# Patient Record
Sex: Female | Born: 1970 | Race: White | Hispanic: No | Marital: Married | Smoking: Never smoker
Health system: Southern US, Community
[De-identification: ages and names within clinical notes are randomized; demographics above are authoritative.]

## PROBLEM LIST (undated history)

## (undated) DIAGNOSIS — R51 Headache: Secondary | ICD-10-CM

## (undated) DIAGNOSIS — R519 Headache, unspecified: Secondary | ICD-10-CM

## (undated) DIAGNOSIS — F418 Other specified anxiety disorders: Secondary | ICD-10-CM

## (undated) HISTORY — DX: Other specified anxiety disorders: F41.8

## (undated) HISTORY — PX: TUBAL LIGATION: SHX77

## (undated) HISTORY — DX: Headache, unspecified: R51.9

## (undated) HISTORY — DX: Headache: R51

---
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
## 2004-08-19 ENCOUNTER — Emergency Department (HOSPITAL_COMMUNITY): Admission: EM | Admit: 2004-08-19 | Discharge: 2004-08-19 | Payer: Self-pay | Admitting: Emergency Medicine

## 2005-04-29 ENCOUNTER — Other Ambulatory Visit: Admission: RE | Admit: 2005-04-29 | Discharge: 2005-04-29 | Payer: Self-pay | Admitting: Family Medicine

## 2006-05-20 ENCOUNTER — Other Ambulatory Visit: Admission: RE | Admit: 2006-05-20 | Discharge: 2006-05-20 | Payer: Self-pay | Admitting: Family Medicine

## 2006-08-01 ENCOUNTER — Inpatient Hospital Stay (HOSPITAL_COMMUNITY): Admission: AD | Admit: 2006-08-01 | Discharge: 2006-08-01 | Payer: Self-pay | Admitting: Obstetrics and Gynecology

## 2006-09-17 ENCOUNTER — Inpatient Hospital Stay (HOSPITAL_COMMUNITY): Admission: AD | Admit: 2006-09-17 | Discharge: 2006-09-17 | Payer: Self-pay | Admitting: Obstetrics and Gynecology

## 2006-10-30 ENCOUNTER — Inpatient Hospital Stay (HOSPITAL_COMMUNITY): Admission: AD | Admit: 2006-10-30 | Discharge: 2006-10-30 | Payer: Self-pay | Admitting: Obstetrics and Gynecology

## 2006-11-29 ENCOUNTER — Inpatient Hospital Stay (HOSPITAL_COMMUNITY): Admission: AD | Admit: 2006-11-29 | Discharge: 2006-11-29 | Payer: Self-pay | Admitting: Obstetrics and Gynecology

## 2006-12-22 ENCOUNTER — Encounter: Admission: RE | Admit: 2006-12-22 | Discharge: 2006-12-22 | Payer: Self-pay | Admitting: Obstetrics and Gynecology

## 2007-03-10 ENCOUNTER — Inpatient Hospital Stay (HOSPITAL_COMMUNITY): Admission: RE | Admit: 2007-03-10 | Discharge: 2007-03-12 | Payer: Self-pay | Admitting: Obstetrics and Gynecology

## 2007-03-11 ENCOUNTER — Encounter (INDEPENDENT_AMBULATORY_CARE_PROVIDER_SITE_OTHER): Payer: Self-pay | Admitting: Obstetrics and Gynecology

## 2007-10-26 HISTORY — PX: LUMBAR DISC SURGERY: SHX700

## 2007-11-09 ENCOUNTER — Ambulatory Visit (HOSPITAL_COMMUNITY): Admission: RE | Admit: 2007-11-09 | Discharge: 2007-11-10 | Payer: Self-pay | Admitting: Orthopedic Surgery

## 2008-06-19 IMAGING — CR DG LUMBAR SPINE 2-3V
3 series · 3 of 3 positions shown · non-contrast
Comparison: none

CLINICAL DATA: L4-5 herniated disk.  
LUMBAR SPINE ? 3 VIEW:

[view not recorded (1 of 3)]
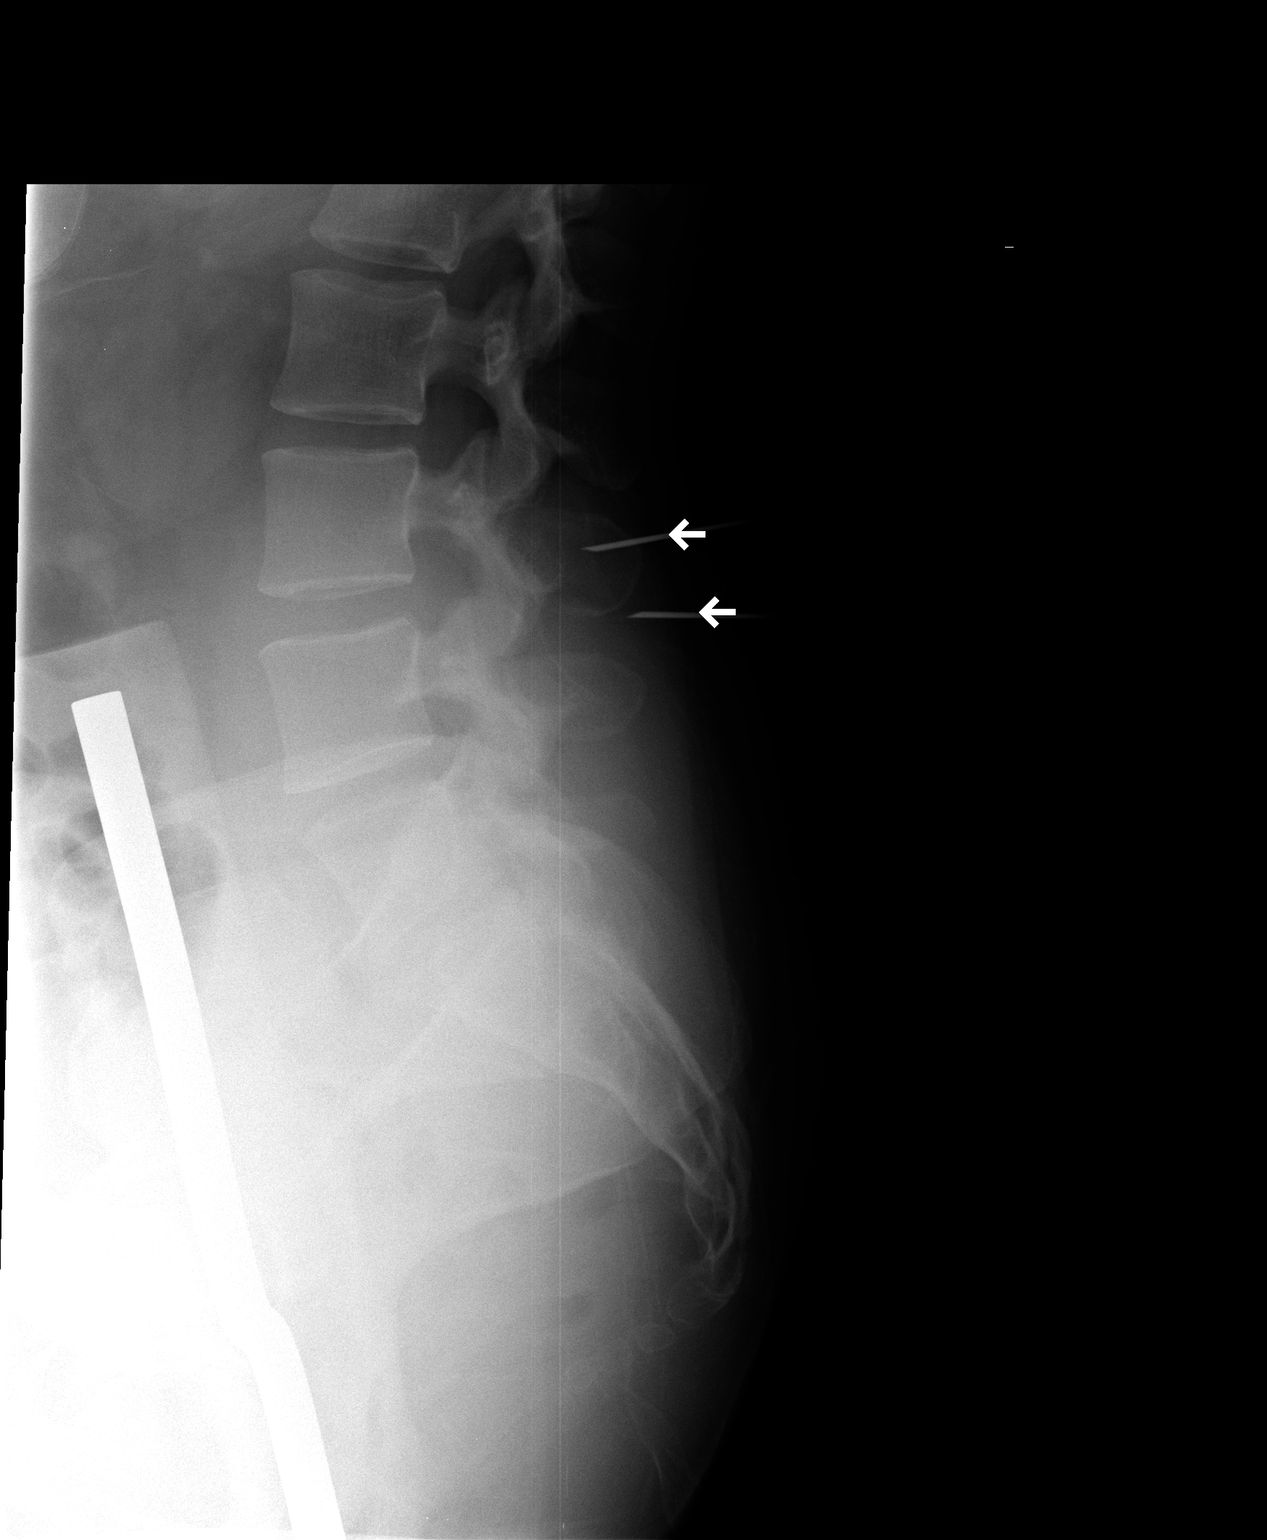

[view not recorded (2 of 3)]
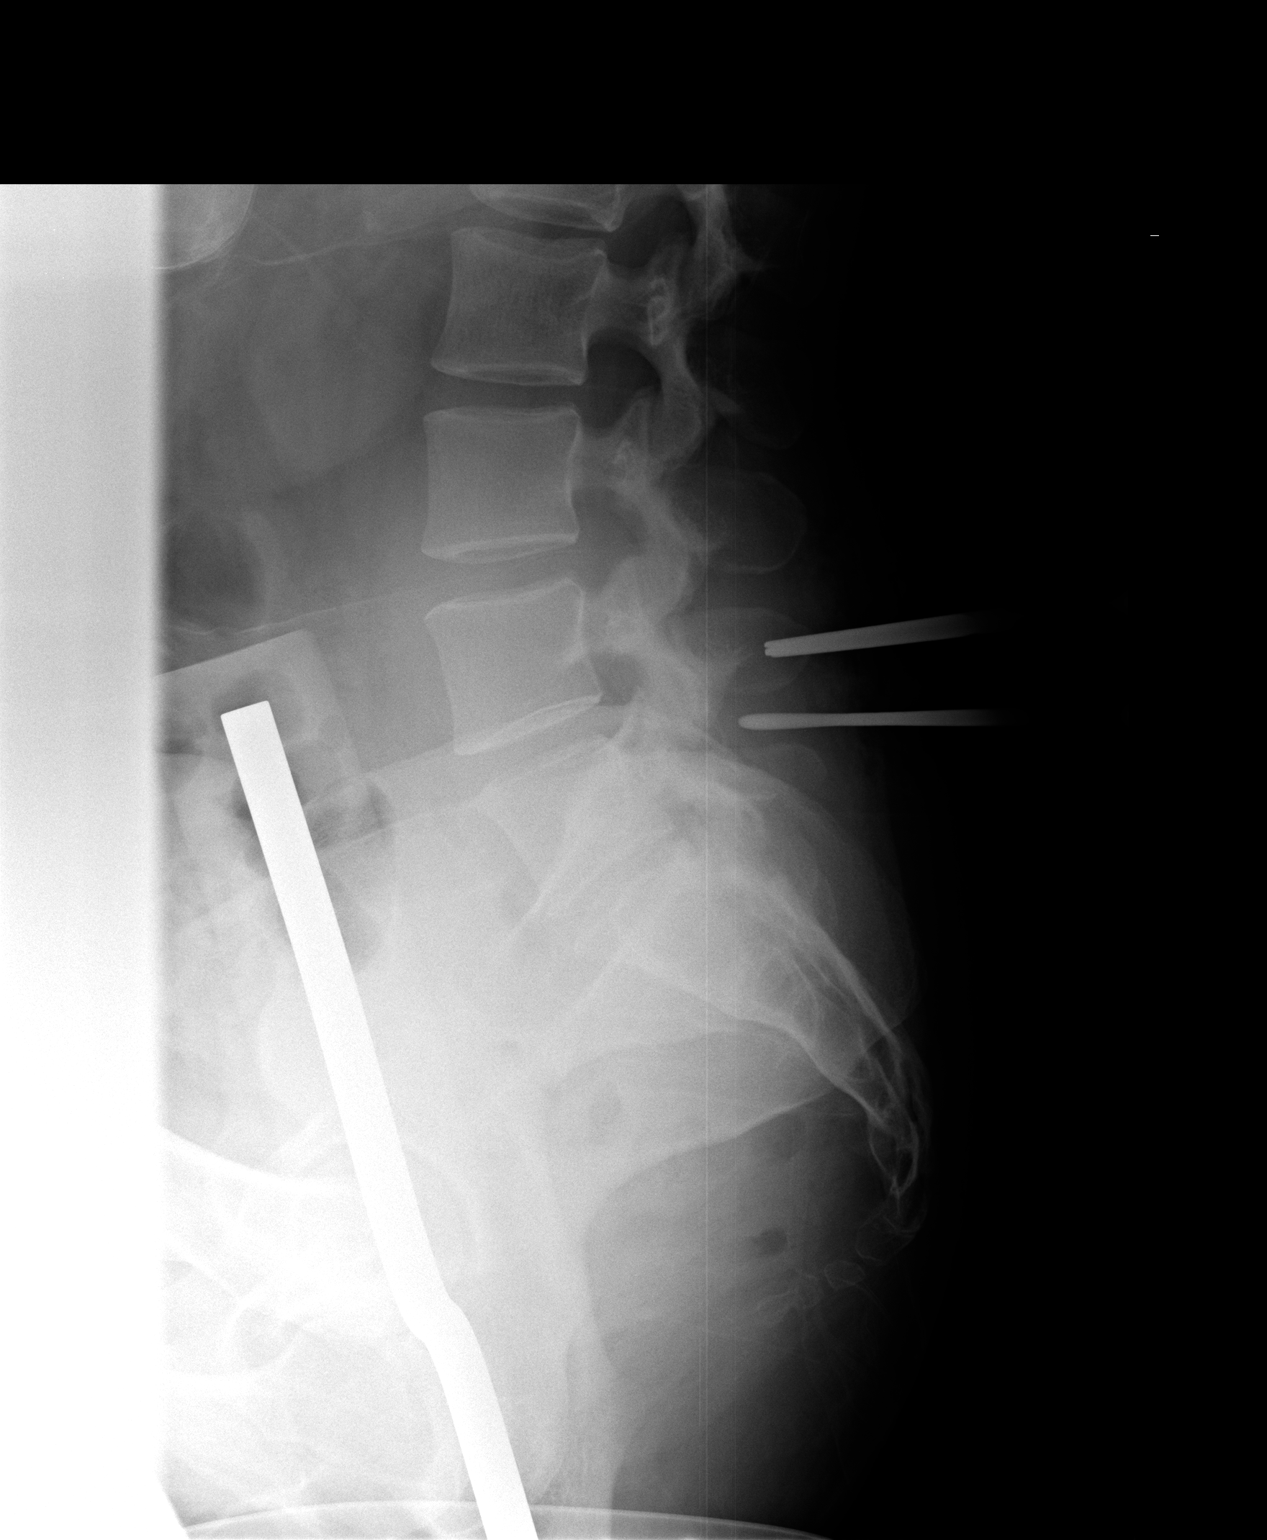

[view not recorded (3 of 3)]
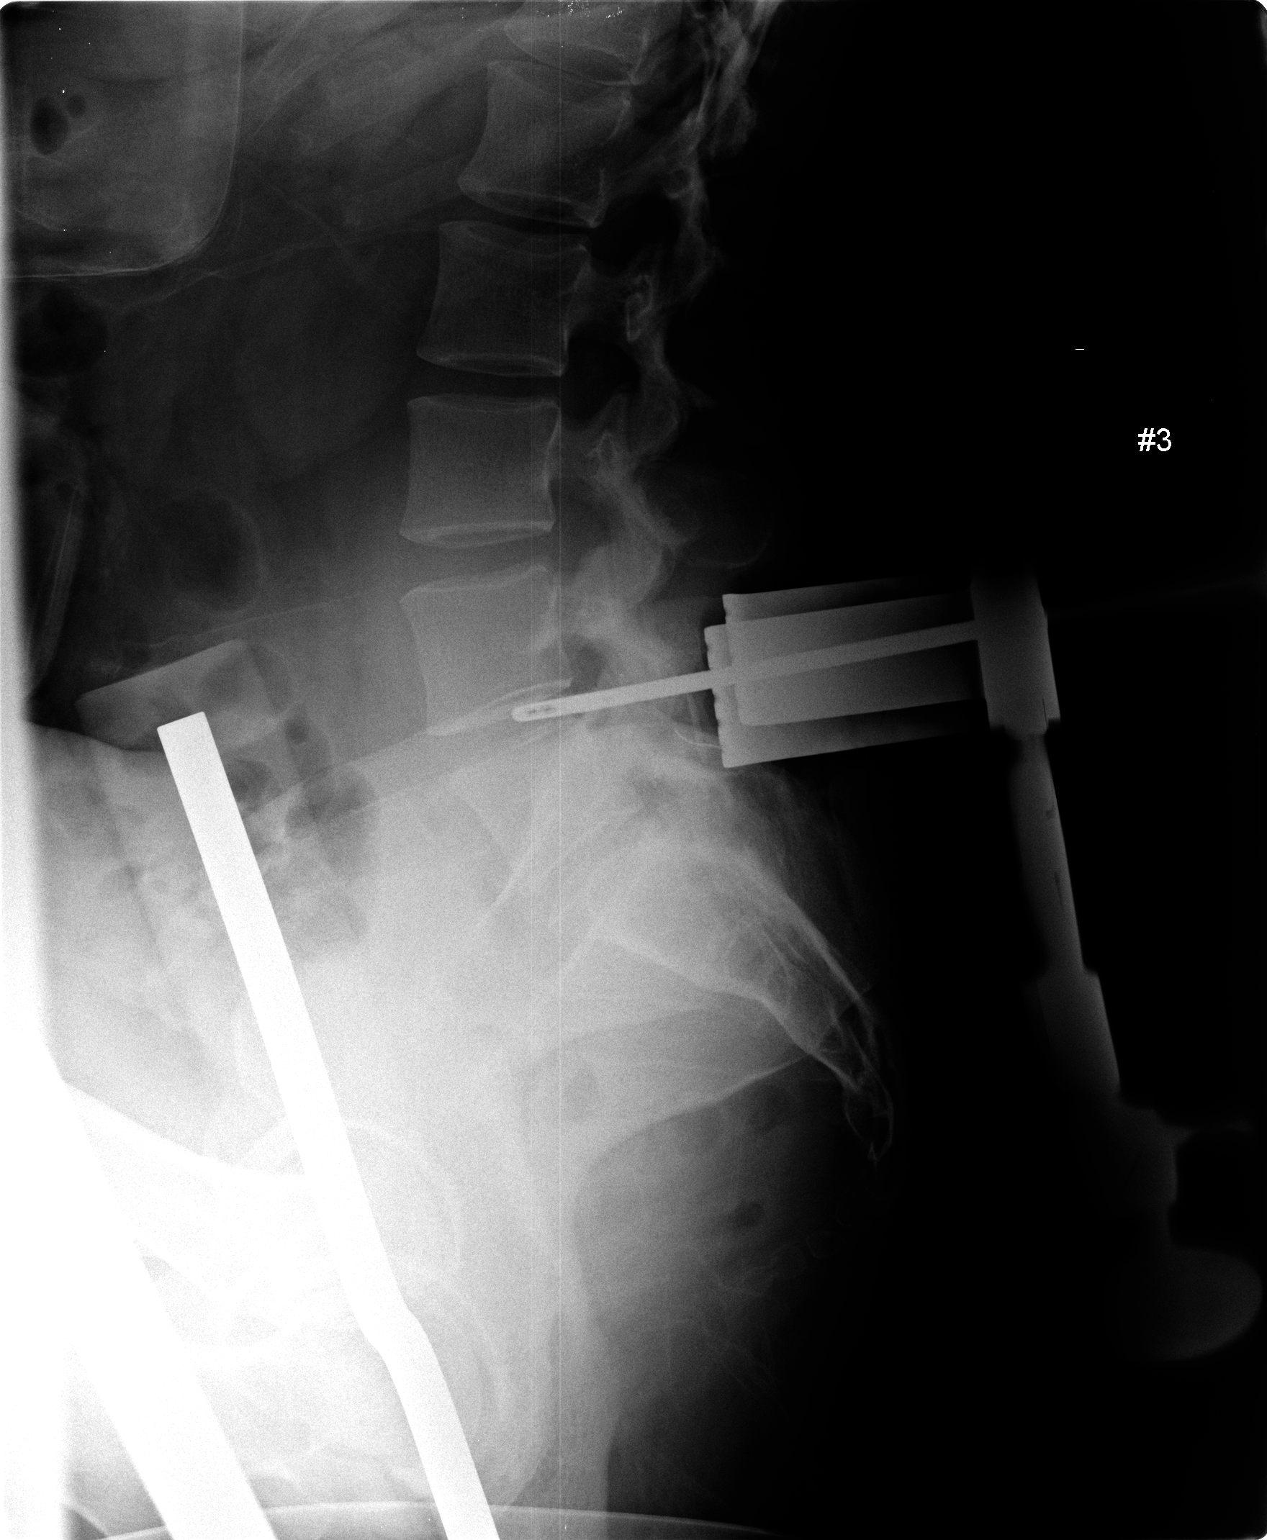

[3 of 3 positions shown; findings below may reference images not displayed]

FINDINGS: Two intraoperative lumbar spine films were obtained for localization.  Film 1 demonstrates radiopaque needles posterior to the L3-4.  Film 2 demonstrates localizers posterior to L4-5.
IMPRESSION: Operative localization L4-5.

## 2011-03-09 NOTE — Op Note (Signed)
NAME:  Meghan Caldwell, Meghan Caldwell NO.:  [id number]   MEDICAL RECORD NO.:  [id number]          PATIENT TYPE:  INP   LOCATION:  9128                          FACILITY:  [facility]   PHYSICIAN:  Huel Cote, M.D. DATE OF BIRTH:  [birth year]   DATE OF PROCEDURE:  03/11/2007  DATE OF DISCHARGE:                               OPERATIVE REPORT   PREOPERATIVE DIAGNOSES:  1. Status post normal spontaneous vaginal delivery.  2. Desires permanent sterility.  3. Previous ectopic pregnancy on the left, with surgery on that side.      The patient is unsure what exactly was done.   POSTOPERATIVE DIAGNOSES:  1. Status post normal spontaneous vaginal delivery.  2. Desires permanent sterility.  3. Previous ectopic pregnancy on the left, with surgery on that side.      The patient is unsure what exactly was done.   PROCEDURE:  Partial right salpingectomy.  The left tube appeared to have  a stump only, with a probable previous salpingectomy of that tube with  her ectopic pregnancy.   SURGEON:  Huel Cote, M.D.   ASSISTANT:  None.   ANESTHESIA:  Spinal.   ESTIMATED BLOOD LOSS:  Minimal.   IV FLUIDS:  1800 mL lactated Ringers.   FINDINGS:  The right tube and ovary were within normal limits.  The left  tubal stump was noted to be approximately 3 cm long; it was blind  ending.  There were adhesions noted to the left adnexa, with a possible  focus of endometriosis on the left ovary; however, adhesions of the  mesentery and bowel in that area did not make it possible to further  investigate this.  There was no further left tube identified beyond the  stump.   PROCEDURE:  The patient was taken to the operating room, where spinal  anesthesia was obtained without difficulty.  She was then prepped and  draped in a normal sterile fashion in the dorsal supine position.  A  small umbilical incision was then made at the preexisting scar, and  carried through to underlying layer of fascia by  sharp dissection and  Bovie cautery.  The fascia was then opened and the peritoneal cavity  entered bluntly.  The peritoneal cavity was inspected and appeared  normal.  There were no adhesions to the upper abdomen, although there  were adhesions noted to track down to the left adnexa.  The right adnexa  was identified.  The fallopian tube on that side was identified, grasped  with a Babcock clamp and traced out to its fimbriated end.  The tube  itself was then opened in the mesosalpinx, and a 2-3 cm buckle tied off  with 2 free ties of 0 plain.  The tubal segment was then completely  excised and the pedicle was inspected and found to be hemostatic.  It  was additionally cauterized with Bovie cautery at the tubal stumps.  This was examined and found to be hemostatic.  It was returned to the  abdomen.   Attention was then turned to the patient's left side, where it was  closely inspected.  The tubal stump was identified and approximately 3  cm long and blind ending.  There was no additional tube noted.  There  were adhesions to the left adnexa, and possibility of a focus of  endometriosis on the left ovary.  It was not easy to inspect this area  due to the adhesions.  Therefore, all was left in place and all  instruments and sponges were removed from the patient's abdomen.  The  fascia was closed in with 0 Vicryl in a running fashion, and the skin  was closed with 3-0 Vicryl subcutaneous stitch.      Huel Cote, M.D.  Electronically Signed     KR/MEDQ  D:  03/11/2007  T:  03/11/2007  Job:  [id number]

## 2011-03-09 NOTE — Op Note (Signed)
NAME:  Meghan Caldwell, Meghan Caldwell               ACCOUNT NO.:  [id number]   MEDICAL RECORD NO.:  [id number]          PATIENT TYPE:  AMB   LOCATION:  DAY                          FACILITY:  [facility]   PHYSICIAN:  Georges Lynch. Gioffre, M.D.DATE OF BIRTH:  12-03-1970   DATE OF PROCEDURE:  11/09/2007  DATE OF DISCHARGE:                               OPERATIVE REPORT   SURGEON:  Georges Lynch. Darrelyn Hillock, M.D.   ASSISTANT:  Marlowe Kays MD   PREOPERATIVE DIAGNOSIS:  Herniated lumbar disk L4-5 on the right with a  partial foot drop on the right.  Note the disk migrated somewhat  distally over the body of L5.   POSTOPERATIVE DIAGNOSIS:  Herniated lumbar disk L4-5 on the right with a  partial foot drop on the right.  Note the disk migrated somewhat  distally over the body of L5.   OPERATION:  1. Hemilaminectomy, microdiskectomy at L4-5 on the right.  2. Foraminotomy at L4-5 of the right.   PROCEDURE:  Under general anesthesia the patient on spinal frame,  routine orthopedic prep and drape of the lower back was carried out.  At  this time the patient had 1 gram of IV Ancef.  Two needles were placed  in the back for localization purposes and an x-ray was taken.  Once we  localized the L4-5 space, an incision was made directly over the  midline.  Bleeders identified and cauterized.  The muscle then was  separated the lamina and spinous process in the usual fashion.  Another  x-ray was taken to verify the position.  Following that we inserted the  [facility] retractors.  At that time we then went down and carried out  our hemilaminectomy in the usual fashion, removed the ligamentum flavum.  The microscope was used at all times for this.  We gently retracted the  dura once the dura was identified and then went out laterally and  decompressed the lateral recess and identified lateral recess veins.  Following that we cauterized lateral recess veins.  The nerve root was  identified, a foraminotomy was carried out.   The nerve root was quite  tense with a herniated disk.  We then made a cruciate incision into the  disk space and did a microdiskectomy in the usual fashion.  I utilized a  nerve hook and the Epstein curettes to go subligamentous distally,  medially and proximally to make sure there were no other loose  fragments.  We retained one large fragment that migrated somewhat distal  and was subligamentous.  We then removed the second fragment.  Following  that we went in the axilla of the root with a Penfield 4, root was free.  We went down into the foramen with the hockey-stick and the root now was  free.  We went medially and proximally and out laterally make sure there  were no other herniated disk material, there was not.  The root was  free.  We put the instrument in disk space, took another x-ray and  verified the exact position.  Thoroughly irrigated out the area and then  loosely applied  some thrombin-soaked  Gelfoam.  We then closed the wound layers usual fashion.  I left a small  deep distal portion of the wound open for drainage purposes.  Subcu was  closed with 0-0 Vicryl, skin was closed metal staples.  The patient had  30 mg IV Toradol prior to being released from surgery.  Sterile  Neosporin dressing was applied.           ______________________________  Georges Lynch Darrelyn Hillock, M.D.     RAG/MEDQ  D:  11/09/2007  T:  11/10/2007  Job:  [id number]

## 2011-03-09 NOTE — Discharge Summary (Signed)
NAME:  HELENA, SARDO NO.:  [id number]   MEDICAL RECORD NO.:  [id number]          PATIENT TYPE:  INP   LOCATION:  9128                          FACILITY:  [facility]   PHYSICIAN:  Huel Cote, M.D. DATE OF BIRTH:  05-Dec-1970   DATE OF ADMISSION:  03/10/2007  DATE OF DISCHARGE:  03/12/2007                               DISCHARGE SUMMARY   DISCHARGE DIAGNOSES:  1. Term pregnancy at 39-3/7 weeks delivered.  2. Status post normal spontaneous vaginal delivery.  3. Gestational diabetes well controlled on diet.  4. Desired permanent sterility with a postpartum sterilization with a      right salpingectomy.  The patient had a previous left salpingectomy      with an ectopic pregnancy.   DISCHARGE MEDICATIONS:  1. Motrin 600 mg p.o. every 6 hours.  2. Percocet 1-2 tablets p.o. every 4 hours p.r.n.   DISCHARGE FOLLOWUP:  The patient's follow-up in the office in 2 weeks  for an incision check and again in 6 weeks for her full postpartum exam.   HISTORY OF PRESENT ILLNESS:  The patient is a 40 year old G4, P 2-0-1-2  was admitted at 39-3/[redacted] weeks gestation for induction of labor given  increasing back pain and was generally feeling miserable. She had a  favorable cervix.  Prenatal care been complicated by advanced maternal  age with a normal first trimester screen and 18 weeks ultrasound.  Gestational diabetes had been well controlled by diet and the patient  had a history of depression stable off her medications.  She also  desired postpartum tubal ligation.   PRENATAL LABS:  Blood type A negative, antibody negative, rubella  immune, hepatitis B surface antigen negative, RPR nonreactive, GC  negative, chlamydia negative, HIV declined, 1-hour Glucola 168, 3-hour  76, 189, 208 and 182, group B strep negative.   PAST OBSTETRICAL HISTORY:  In 1989 she had an 8-pound vaginal delivery  and in 1992 she had ectopic pregnancy with a partial left salpingectomy  and in 1994 she  had a 7-pound vaginal delivery.   PAST GYN HISTORY:  Partial left salpingectomy.   PAST MEDICAL HISTORY:  History of depression stable now off medications.   PAST SURGICAL HISTORY:  Her ectopic in 1992 as stated with left  salpingectomy.   MEDICATIONS:  Prenatal vitamins.   ALLERGIES:  None   PHYSICAL EXAMINATION:  VITAL SIGNS:  She was afebrile with stable vital  signs.  Blood sugar on admission was 82.  Fetal heart rate was reactive.  PELVIC:  Cervix was 53 and -2 station.  She had rupture of membranes and  clear fluid was noted.   HOSPITAL COURSE:  She is placed on Pitocin and progressed rather quickly  to complete dilation.  She pushed well and began having variable  decelerations with contractions about 30 minutes prior to delivery.  These became more persistent at delivery and eventually the patient had  a decelerations to approximately 60-70 at a +3 station.  Therefore, with  slow recovery the kiwi vacuum was applied and over one contraction with  minimal effort the head was pulled to  crowning. Maternal effort pushed  out the remainder of the baby, Apgars nine and nine, weight was 8 pounds  1 ounce.  Placenta delivered spontaneously.  There is a small first-  degree laceration which was repaired with 2-0 Vicryl.  Cervix rectum  were intact.  Estimated blood loss was about approximately 350 mL. The  patient was admitted for routine care. On postpartum day #1 at her  request she underwent a tubal sterilization procedure as stated.  She  had a partial right salpingectomy as her left tube was apparently just a  3 cm stump and had been removed previously. She was then continued in  her postpartum care.  Her day of discharge hemoglobin was 10.4. Incision  was clean, dry, intact.  She was doing well, tolerating her pain with  p.o. medications and everything else was within normal limits.  She was  afebrile with stable vital signs.  She was counseled to return in 2  weeks for  incision check and that point we will obtain a copy of her  operative report from her ectopic pregnancy to ensure and confirm a left  salpingectomy took place but that is what was apparent at the time of  surgery.      Huel Cote, M.D.  Electronically Signed     KR/MEDQ  D:  03/12/2007  T:  03/12/2007  Job:  [id number]

## 2011-07-15 LAB — COMPREHENSIVE METABOLIC PANEL
ALT: 13
AST: 17
Albumin: 3.5
Alkaline Phosphatase: 88
Calcium: 9.1
Potassium: 4.1
Sodium: 142
Total Protein: 6.7

## 2011-07-15 LAB — URINALYSIS, ROUTINE W REFLEX MICROSCOPIC
Glucose, UA: NEGATIVE
Ketones, ur: NEGATIVE
Leukocytes, UA: NEGATIVE
Protein, ur: NEGATIVE
Urobilinogen, UA: 0.2

## 2011-07-15 LAB — TYPE AND SCREEN
ABO/RH(D): A NEG
Antibody Screen: NEGATIVE

## 2011-07-15 LAB — PROTIME-INR: Prothrombin Time: 13.5

## 2011-07-15 LAB — CBC
MCHC: 35.2
MCV: 96
Platelets: 243
RBC: 3.87
RDW: 13.1

## 2011-07-15 LAB — DIFFERENTIAL
Eosinophils Absolute: 0.1
Eosinophils Relative: 1
Lymphocytes Relative: 30
Lymphs Abs: 1.7
Monocytes Relative: 7

## 2013-06-04 ENCOUNTER — Ambulatory Visit (INDEPENDENT_AMBULATORY_CARE_PROVIDER_SITE_OTHER): Payer: BC Managed Care – PPO | Admitting: General Practice

## 2013-06-04 ENCOUNTER — Encounter: Payer: Self-pay | Admitting: General Practice

## 2013-06-04 VITALS — BP 109/77 | HR 66 | Temp 98.4°F | Ht 62.0 in | Wt 134.0 lb

## 2013-06-04 DIAGNOSIS — F411 Generalized anxiety disorder: Secondary | ICD-10-CM

## 2013-06-04 DIAGNOSIS — H00023 Hordeolum internum right eye, unspecified eyelid: Secondary | ICD-10-CM

## 2013-06-04 DIAGNOSIS — H00029 Hordeolum internum unspecified eye, unspecified eyelid: Secondary | ICD-10-CM

## 2013-06-04 MED ORDER — SULFACETAMIDE SODIUM 10 % OP SOLN: 1.0000 [drp] | Freq: Four times a day (QID) | OPHTHALMIC | Status: DC

## 2013-06-04 MED ORDER — ESCITALOPRAM OXALATE 10 MG PO TABS: 10.0000 mg | ORAL_TABLET | Freq: Every day | ORAL | Status: DC

## 2013-06-04 NOTE — Patient Instructions (Addendum)
Sty A sty (hordeolum) is an infection of a gland in the eyelid located at the base of the eyelash. A sty may develop a white or yellow head of pus. It can be puffy (swollen). Usually, the sty will burst and pus will come out on its own. They do not leave lumps in the eyelid once they drain. A sty is often confused with another form of cyst of the eyelid called a chalazion. Chalazions occur within the eyelid and not on the edge where the bases of the eyelashes are. They often are red, sore and then form firm lumps in the eyelid. CAUSES   Germs (bacteria).  Lasting (chronic) eyelid inflammation. SYMPTOMS   Tenderness, redness and swelling along the edge of the eyelid at the base of the eyelashes.  Sometimes, there is a white or yellow head of pus. It may or may not drain. DIAGNOSIS  An ophthalmologist will be able to distinguish between a sty and a chalazion and treat the condition appropriately.  TREATMENT   Styes are typically treated with warm packs (compresses) until drainage occurs.  In rare cases, medicines that kill germs (antibiotics) may be prescribed. These antibiotics may be in the form of drops, cream or pills.  If a hard lump has formed, it is generally necessary to do a small incision and remove the hardened contents of the cyst in a minor surgical procedure done in the office.  In suspicious cases, your caregiver may send the contents of the cyst to the lab to be certain that it is not a rare, but dangerous form of cancer of the glands of the eyelid. HOME CARE INSTRUCTIONS   Wash your hands often and dry them with a clean towel. Avoid touching your eyelid. This may spread the infection to other parts of the eye.  Apply heat to your eyelid for 10 to 20 minutes, several times a day, to ease pain and help to heal it faster.  Do not squeeze the sty. Allow it to drain on its own. Wash your eyelid carefully 3 to 4 times per day to remove any pus. SEEK IMMEDIATE MEDICAL CARE IF:    Your eye becomes painful or puffy (swollen).  Your vision changes.  Your sty does not drain by itself within 3 days.  Your sty comes back within a short period of time, even with treatment.  You have redness (inflammation) around the eye.  You have a fever. Document Released: 07/21/2005 Document Revised: 01/03/2012 Document Reviewed: 03/25/2009 [employer] Patient Information 2014 [employer].  

## 2013-06-04 NOTE — Progress Notes (Signed)
  Subjective:    Patient ID: Nadene Rubins, female    DOB: 12/24/1970, 42 y.o.   MRN: [id number]  HPI Patient presents today with complaints of a sty on right lower eye lid. She reports onset was two weeks ago. She reports feeling better and has almost completely cleared up. She reports using warm compresses, but denies OTC medications.  Reports feeling anxious, racing thoughts, and having mood swings at times. She reports it has gradually gotten worse. She reports try to control on her own, but not effective. She reports taking zoloft in the past and willing to try something else.     Review of Systems  Constitutional: Negative for fever and chills.  Eyes: Positive for redness. Negative for photophobia, pain, discharge, itching and visual disturbance.       Right lower inner lid red, bump  Respiratory: Negative for chest tightness and shortness of breath.   Cardiovascular: Negative for chest pain and palpitations.  Neurological: Negative for dizziness, weakness and headaches.       Objective:   Physical Exam  Constitutional: She is oriented to person, place, and time. She appears well-developed and well-nourished.  HENT:  Head: Normocephalic and atraumatic.  Right Ear: External ear normal.  Left Ear: External ear normal.  Eyes: EOM are normal. Pupils are equal, round, and reactive to light. Right eye exhibits hordeolum.  Cardiovascular: Normal rate, regular rhythm and normal heart sounds.   Pulmonary/Chest: Effort normal and breath sounds normal.  Neurological: She is alert and oriented to person, place, and time.  Skin: Skin is warm and dry.  Psychiatric: She has a normal mood and affect.          Assessment & Plan:  1. Sty, internal, right - sulfacetamide (BLEPH-10) 10 % ophthalmic solution; Place 1 drop into the right eye 4 (four) times daily.  Dispense: 15 mL; Refill: 1 -warm compresses to right eyelid as directed -Sty instruction sheet provided and discussed   2.  Generalized anxiety disorder - escitalopram (LEXAPRO) 10 MG tablet; Take 1 tablet (10 mg total) by mouth daily.  Dispense: 30 tablet; Refill: 0 -identify factors that causes anxiety -reduction of stress -RTO in one week for follow up and prn Patient verbalized understanding Coralie Keens, FNP-C

## 2013-06-11 ENCOUNTER — Ambulatory Visit (INDEPENDENT_AMBULATORY_CARE_PROVIDER_SITE_OTHER): Payer: BC Managed Care – PPO | Admitting: General Practice

## 2013-06-11 ENCOUNTER — Encounter: Payer: Self-pay | Admitting: General Practice

## 2013-06-11 VITALS — BP 109/72 | HR 61 | Temp 98.2°F | Ht 61.0 in | Wt 134.0 lb

## 2013-06-11 DIAGNOSIS — F411 Generalized anxiety disorder: Secondary | ICD-10-CM

## 2013-06-11 DIAGNOSIS — G44209 Tension-type headache, unspecified, not intractable: Secondary | ICD-10-CM

## 2013-06-11 MED ORDER — BUTALBITAL-APAP-CAFFEINE 50-325-40 MG PO TABS: 1.0000 | ORAL_TABLET | Freq: Two times a day (BID) | ORAL | Status: DC | PRN

## 2013-06-11 MED ORDER — ESCITALOPRAM OXALATE 10 MG PO TABS: 10.0000 mg | ORAL_TABLET | Freq: Every day | ORAL | Status: DC

## 2013-06-11 NOTE — Patient Instructions (Addendum)

## 2013-06-11 NOTE — Progress Notes (Signed)
  Subjective:    Patient ID: Meghan Caldwell, female    DOB: Mar 02, 1971, 42 y.o.   MRN: [id number]  HPI Patient presents today for follow up of anti-anxiety medication. She reports feeling less anxious and medication is effective. She reports having headaches periodically which have been an issue for more than one year. She reports having an MRI, with negative results.     Review of Systems  Constitutional: Negative for fever and chills.  Respiratory: Negative for chest tightness and shortness of breath.   Cardiovascular: Negative for chest pain and palpitations.  Neurological: Positive for headaches. Negative for dizziness, weakness and numbness.       Frontal headache  Psychiatric/Behavioral: Negative for agitation. The patient is not nervous/anxious.        Objective:   Physical Exam  Constitutional: She is oriented to person, place, and time. She appears well-developed and well-nourished.  HENT:  Head: Normocephalic and atraumatic.  Right Ear: External ear normal.  Left Ear: External ear normal.  Nose: Nose normal.  Mouth/Throat: Oropharynx is clear and moist.  Cardiovascular: Normal rate, regular rhythm and normal heart sounds.   Pulmonary/Chest: Effort normal and breath sounds normal.  Neurological: She is alert and oriented to person, place, and time.  Skin: Skin is warm and dry.  Psychiatric: She has a normal mood and affect.          Assessment & Plan:  1. Tension headache - butalbital-acetaminophen-caffeine (FIORICET, ESGIC) 50-325-40 MG per tablet; Take 1 tablet by mouth 2 (two) times daily as needed for headache.  Dispense: 21 tablet; Refill: 0   2. Generalized anxiety disorder - escitalopram (LEXAPRO) 10 MG tablet; Take 1 tablet (10 mg total) by mouth daily.  Dispense: 30 tablet; Refill: 3 -discussed stress and anxiety reduction -RTO if symptoms worsen or seek emergency medical treatment -keep follow up appointments  -Patient verbalized understanding Coralie Keens, FNP-C

## 2013-08-29 ENCOUNTER — Ambulatory Visit (INDEPENDENT_AMBULATORY_CARE_PROVIDER_SITE_OTHER): Payer: BC Managed Care – PPO | Admitting: General Practice

## 2013-08-29 ENCOUNTER — Encounter: Payer: Self-pay | Admitting: General Practice

## 2013-08-29 ENCOUNTER — Encounter (INDEPENDENT_AMBULATORY_CARE_PROVIDER_SITE_OTHER): Payer: Self-pay

## 2013-08-29 VITALS — BP 116/64 | Ht 61.0 in | Wt 136.0 lb

## 2013-08-29 DIAGNOSIS — L239 Allergic contact dermatitis, unspecified cause: Secondary | ICD-10-CM

## 2013-08-29 DIAGNOSIS — T7840XA Allergy, unspecified, initial encounter: Secondary | ICD-10-CM

## 2013-08-29 DIAGNOSIS — L259 Unspecified contact dermatitis, unspecified cause: Secondary | ICD-10-CM

## 2013-08-29 DIAGNOSIS — G44209 Tension-type headache, unspecified, not intractable: Secondary | ICD-10-CM

## 2013-08-29 DIAGNOSIS — F411 Generalized anxiety disorder: Secondary | ICD-10-CM

## 2013-08-29 MED ORDER — METHYLPREDNISOLONE ACETATE 80 MG/ML IJ SUSP
80.0000 mg | Freq: Once | INTRAMUSCULAR | Status: AC
  Administered 2013-08-29: 80 mg via INTRAMUSCULAR

## 2013-08-29 MED ORDER — PREDNISONE (PAK) 10 MG PO TABS: ORAL_TABLET | ORAL | Status: DC

## 2013-08-29 MED ORDER — BUTALBITAL-APAP-CAFFEINE 50-325-40 MG PO TABS: 1.0000 | ORAL_TABLET | Freq: Two times a day (BID) | ORAL | Status: DC | PRN

## 2013-08-29 MED ORDER — ESCITALOPRAM OXALATE 20 MG PO TABS: 20.0000 mg | ORAL_TABLET | Freq: Every day | ORAL | Status: DC

## 2013-08-29 NOTE — Patient Instructions (Signed)

## 2013-08-29 NOTE — Progress Notes (Signed)
  Subjective:    Patient ID: Meghan Caldwell, female    DOB: 02-06-1971, 42 y.o.   MRN: [id number]  Urticaria This is a recurrent problem. The current episode started 1 to 4 weeks ago. The problem has been gradually worsening since onset. The affected locations include the lips. The rash is characterized by itchiness and redness. It is unknown if there was an exposure to a precipitant. Pertinent negatives include no congestion, cough, diarrhea, eye pain, fever, shortness of breath, sore throat or vomiting. Past treatments include antihistamine. The treatment provided mild relief. There is no history of allergies, asthma or eczema.  Reports lexapro was very effective, but now it seems to wear off later in the evening. She reports a noticeable decrease in her anxiety level. Also reports headache occurrences have decreased to one every two weeks; therefore fioricet is effective.     Review of Systems  Constitutional: Negative for fever and chills.  HENT: Negative for congestion and sore throat.   Eyes: Negative for pain, redness and visual disturbance.  Respiratory: Negative for cough, chest tightness and shortness of breath.   Cardiovascular: Negative for chest pain and palpitations.  Gastrointestinal: Negative for vomiting and diarrhea.  Neurological: Negative for dizziness, weakness and headaches.       Objective:   Physical Exam  Constitutional: She is oriented to person, place, and time. She appears well-developed and well-nourished.  HENT:  Head: Normocephalic and atraumatic.  Right Ear: External ear normal.  Left Ear: External ear normal.  Pulmonary/Chest: Effort normal and breath sounds normal. No respiratory distress. She exhibits no tenderness.  Neurological: She is alert and oriented to person, place, and time.  Skin: Skin is warm and dry.  Psychiatric: She has a normal mood and affect.          Assessment & Plan:  1. Allergic reaction, initial encounter   2. Allergic  dermatitis  - methylPREDNISolone acetate (DEPO-MEDROL) injection 80 mg; Inject 1 mL (80 mg total) into the muscle once. - predniSONE (STERAPRED UNI-PAK) 10 MG tablet; Start taking on 08/30/13  Dispense: 21 tablet; Refill: 0 -avoid known irritants -maintain a diary of allergic episodes as discussed -seek emergency medical treatment if symptoms worsen - Ambulatory referral to Allergy  3. Generalized anxiety disorder -lexapro increased from 10mg  to 20mg  - escitalopram (LEXAPRO) 20 MG tablet; Take 1 tablet (20 mg total) by mouth daily.  Dispense: 30 tablet; Refill: 3 -follow up in one week  4. Tension headache  - butalbital-acetaminophen-caffeine (FIORICET, ESGIC) 50-325-40 MG per tablet; Take 1 tablet by mouth 2 (two) times daily as needed for headache.  Dispense: 21 tablet; Refill: 0 -Patient verbalized understanding -Coralie Keens, FNP-C

## 2013-09-05 ENCOUNTER — Ambulatory Visit (INDEPENDENT_AMBULATORY_CARE_PROVIDER_SITE_OTHER): Payer: BC Managed Care – PPO | Admitting: General Practice

## 2013-09-05 ENCOUNTER — Encounter: Payer: Self-pay | Admitting: General Practice

## 2013-09-05 VITALS — BP 124/79 | HR 60 | Temp 97.9°F | Wt 136.5 lb

## 2013-09-05 DIAGNOSIS — L509 Urticaria, unspecified: Secondary | ICD-10-CM

## 2013-09-05 DIAGNOSIS — F411 Generalized anxiety disorder: Secondary | ICD-10-CM

## 2013-09-05 MED ORDER — ESCITALOPRAM OXALATE 20 MG PO TABS: 20.0000 mg | ORAL_TABLET | Freq: Every day | ORAL | Status: DC

## 2013-09-05 NOTE — Progress Notes (Signed)
  Subjective:    Patient ID: Meghan Caldwell, female    DOB: 08-30-1971, 42 y.o.   MRN: [id number]  HPI Patient presents today for follow up of medication adjustment. She reports lexapro increase is very effective. She reports anxiety and moods are well controlled. Also denies any hives or skin eruptions since last visit. Reports prednisone dose pack is completed.     Review of Systems  Constitutional: Negative for fever and chills.  Respiratory: Negative for cough, chest tightness, shortness of breath and wheezing.   Cardiovascular: Negative for chest pain and palpitations.  Gastrointestinal: Negative for abdominal pain, diarrhea, constipation and blood in stool.  Genitourinary: Negative for dysuria, hematuria and difficulty urinating.  Musculoskeletal: Negative for back pain, neck pain and neck stiffness.  Neurological: Negative for dizziness, weakness and headaches.       Objective:   Physical Exam  Constitutional: She is oriented to person, place, and time. She appears well-developed and well-nourished.  Cardiovascular: Normal rate, regular rhythm and normal heart sounds.   Pulmonary/Chest: Effort normal and breath sounds normal. No respiratory distress. She exhibits no tenderness.  Neurological: She is alert and oriented to person, place, and time.  Skin: Skin is warm and dry.  Psychiatric: She has a normal mood and affect.          Assessment & Plan:  1. Generalized anxiety disorder  - escitalopram (LEXAPRO) 20 MG tablet; Take 1 tablet (20 mg total) by mouth daily.  Dispense: 30 tablet; Refill: 5  2. Hives -hives resolved -maintain diary of foods and activities prior to hives if reoccur -RTO if symptoms return -Patient verbalized understanding -Coralie Keens, FNP-C

## 2013-09-05 NOTE — Patient Instructions (Addendum)

## 2013-09-11 ENCOUNTER — Ambulatory Visit: Payer: BC Managed Care – PPO | Admitting: General Practice

## 2013-09-24 ENCOUNTER — Ambulatory Visit: Payer: BC Managed Care – PPO | Admitting: General Practice

## 2013-10-22 ENCOUNTER — Institutional Professional Consult (permissible substitution): Payer: Self-pay | Admitting: Internal Medicine

## 2013-11-22 ENCOUNTER — Ambulatory Visit (INDEPENDENT_AMBULATORY_CARE_PROVIDER_SITE_OTHER): Payer: BC Managed Care – PPO | Admitting: Internal Medicine

## 2013-11-22 ENCOUNTER — Other Ambulatory Visit (INDEPENDENT_AMBULATORY_CARE_PROVIDER_SITE_OTHER): Payer: BC Managed Care – PPO

## 2013-11-22 ENCOUNTER — Encounter: Payer: Self-pay | Admitting: Internal Medicine

## 2013-11-22 VITALS — BP 112/76 | HR 68 | Ht 61.0 in | Wt 140.0 lb

## 2013-11-22 DIAGNOSIS — L500 Allergic urticaria: Secondary | ICD-10-CM

## 2013-11-22 DIAGNOSIS — L509 Urticaria, unspecified: Secondary | ICD-10-CM

## 2013-11-22 LAB — SEDIMENTATION RATE: SED RATE: 16 mm/h (ref 0–22)

## 2013-11-22 NOTE — Progress Notes (Signed)
11/22/13- 34 yoF never smoker referred courtesy of Dr Roe Coombs [facility] Pt has pictures with her today.,   Husband here. New onset in October, 2014 of recurrent generalized hives and angioedema of lips. No wheezing, abdominal cramps or throat/ tongue swelling. Nothing similar before in self or relatives. Benadryl helps, but makes her sleepy.  Reports much family health stress issues in the last year. Started Lexapro and Fioricet about one month before first hives. No prior atopy including foods, cosmetics or environmental triggers. Good general health with no hormone, thyroid or liver concerns.   Prior to Admission medications   Medication Sig Start Date End Date Taking? Authorizing Provider  butalbital-acetaminophen-caffeine (FIORICET, ESGIC) 50-325-40 MG per tablet Take 1 tablet by mouth 2 (two) times daily as needed for headache. 08/29/13  Yes Mae Shelda Altes, FNP  escitalopram (LEXAPRO) 20 MG tablet Take 1 tablet (20 mg total) by mouth daily. 09/05/13  Yes Coralie Keens, FNP   Past Medical History  Diagnosis Date  . Anxiety   . Depression    Past Surgical History  Procedure Laterality Date  . Spine surgery      Ruptured disc  . Tubal ligation     History reviewed. No pertinent family history. History   Social History  . Marital Status: Married    Spouse Name: N/A    Number of Children: 3  . Years of Education: N/A   Occupational History  . stay at home mom    Social History Main Topics  . Smoking status: Never Smoker   . Smokeless tobacco: Not on file  . Alcohol Use: Yes     Comment: rare  . Drug Use: No  . Sexual Activity: Yes   Other Topics Concern  . Not on file   Social History Narrative  . No narrative on file   ROS-see HPI Constitutional:   No-   weight loss, night sweats, fevers, chills, fatigue, lassitude. HEENT:   No-  headaches, difficulty swallowing, tooth/dental problems, sore throat,       No-  sneezing, itching, ear ache, nasal  congestion, post nasal drip,  CV:  No-   chest pain, orthopnea, PND, swelling in lower extremities, anasarca,                                  dizziness, palpitations Resp: No-   shortness of breath with exertion or at rest.              No-   productive cough,  No non-productive cough,  No- coughing up of blood.              No-   change in color of mucus.  No- wheezing.   Skin: + HPI GI:  No-   heartburn, indigestion, abdominal pain, nausea, vomiting, diarrhea,                 change in bowel habits, loss of appetite GU: No-   dysuria, change in color of urine, no urgency or frequency.  No- flank pain. MS:  No-   joint pain or swelling.  No- decreased range of motion.  No- back pain. Neuro-     nothing unusual Psych:  No- change in mood or affect. No depression or anxiety.  No memory loss.  OBJ- Physical Exam General- Alert, Oriented, Affect-appropriate, Distress- none acute Skin- rash-none, lesions- none, excoriation- none Lymphadenopathy- none Head- atraumatic  Eyes- Gross vision intact, PERRLA, conjunctivae and secretions clear. +Periorbital                       edema/puffy            Ears- Hearing, canals-normal            Nose- Clear, no-Septal dev, mucus, polyps, erosion, perforation             Throat- Mallampati II , mucosa clear , drainage- none, tonsils- atrophic Neck- flexible , trachea midline, no stridor , thyroid nl, carotid no bruit Chest - symmetrical excursion , unlabored           Heart/CV- RRR , no murmur , no gallop  , no rub, nl s1 s2                           - JVD- none , edema- none, stasis changes- none, varices- none           Lung- clear to P&A, wheeze- none, cough- none , dullness-none, rub- none           Chest wall-  Abd- tender-no, distended-no, bowel sounds-present, HSM- no Br/ Gen/ Rectal- Not done, not indicated Extrem- cyanosis- none, clubbing, none, atrophy- none, strength- nl Neuro- grossly intact to observation

## 2013-11-22 NOTE — Assessment & Plan Note (Signed)
Education done. Question role of medication- lexapro and fioricet started about one month prior to onset of hives. Plan- Allergy profiles, labs for inflammatory markers, start H1 and H2 antihistamine maintenance trial.

## 2013-11-22 NOTE — Patient Instructions (Signed)
Order- lab- Allergy profile, Food IgE profile, ESR/ sed rate, ANA        Dx urticaria  Daily- Claritin/ loratadine H1 antihistamine     Daily - Pepcid/ famotidine H2 antihistamine

## 2013-11-23 LAB — ALLERGEN FOOD PROFILE SPECIFIC IGE
Apple: <0.1 kU/L
Chicken IgE: <0.1 kU/L
Egg White IgE: 0.42 kU/L — ABNORMAL HIGH
IGE (IMMUNOGLOBULIN E), SERUM: 93.5 [IU]/mL (ref 0.0–180.0)
MILK IGE: 0.18 kU/L — AB
Orange: <0.1 kU/L
Shrimp IgE: 0.15 kU/L — ABNORMAL HIGH
Tomato IgE: 0.12 kU/L — ABNORMAL HIGH

## 2013-11-23 LAB — ALLERGY FULL PROFILE
Allergen, D pternoyssinus,d7: 0.55 kU/L — ABNORMAL HIGH
Allergen,Goose feathers, e70: <0.1 kU/L
Aspergillus fumigatus, m3: <0.1 kU/L
Bermuda Grass: 0.37 kU/L — ABNORMAL HIGH
Common Ragweed: <0.1 kU/L
D. farinae: 0.47 kU/L — ABNORMAL HIGH
Dog Dander: <0.1 kU/L
Goldenrod: <0.1 kU/L
Helminthosporium halodes: <0.1 kU/L
House Dust Hollister: <0.1 kU/L
Lamb's Quarters: <0.1 kU/L
Plantain: <0.1 kU/L
Stemphylium Botryosum: <0.1 kU/L

## 2013-11-23 LAB — ANA: Anti Nuclear Antibody(ANA): NEGATIVE

## 2014-01-03 ENCOUNTER — Ambulatory Visit: Payer: BC Managed Care – PPO | Admitting: Internal Medicine

## 2014-01-31 ENCOUNTER — Encounter: Payer: Self-pay | Admitting: *Deleted

## 2014-03-04 ENCOUNTER — Encounter: Payer: Self-pay | Admitting: Nurse Practitioner

## 2014-03-04 ENCOUNTER — Ambulatory Visit (INDEPENDENT_AMBULATORY_CARE_PROVIDER_SITE_OTHER): Payer: BC Managed Care – PPO | Admitting: Nurse Practitioner

## 2014-03-04 VITALS — BP 107/75 | HR 67 | Temp 98.7°F | Ht 61.0 in | Wt 139.0 lb

## 2014-03-04 DIAGNOSIS — F411 Generalized anxiety disorder: Secondary | ICD-10-CM

## 2014-03-04 DIAGNOSIS — G43909 Migraine, unspecified, not intractable, without status migrainosus: Secondary | ICD-10-CM

## 2014-03-04 DIAGNOSIS — F329 Major depressive disorder, single episode, unspecified: Secondary | ICD-10-CM | POA: Insufficient documentation

## 2014-03-04 DIAGNOSIS — F32A Depression, unspecified: Secondary | ICD-10-CM

## 2014-03-04 DIAGNOSIS — G44209 Tension-type headache, unspecified, not intractable: Secondary | ICD-10-CM

## 2014-03-04 DIAGNOSIS — F3289 Other specified depressive episodes: Secondary | ICD-10-CM

## 2014-03-04 MED ORDER — ESCITALOPRAM OXALATE 20 MG PO TABS: 20.0000 mg | ORAL_TABLET | Freq: Every day | ORAL | Status: DC

## 2014-03-04 MED ORDER — BUTALBITAL-APAP-CAFFEINE 50-325-40 MG PO TABS: 1.0000 | ORAL_TABLET | Freq: Two times a day (BID) | ORAL | Status: DC | PRN

## 2014-03-04 NOTE — Progress Notes (Signed)
   Subjective:    Patient ID: Meghan Caldwell, female    DOB: [birth year], 43 y.o.   MRN: [id number]  HPI Patient her today for follow up pf chronic medical problems: Depression/Anxiety  She is currently on lexapro 20mg  -  Has been taking for about 1 year- Doing well- no c/o side effects- Keeps her from worrying so much. Migraines Patient takes fioricet maybe 1 x a week- Migraines do not come as often as they use to.  Review of Systems  Constitutional: Negative.   HENT: Negative.   Respiratory: Negative.   Cardiovascular: Negative.   Gastrointestinal: Negative.   Genitourinary: Negative.   Hematological: Negative.   Psychiatric/Behavioral: Negative.   All other systems reviewed and are negative.      Objective:   Physical Exam  Constitutional: She is oriented to person, place, and time. She appears well-developed and well-nourished.  Eyes: Pupils are equal, round, and reactive to light.  Neck: Normal range of motion. Neck supple.  Cardiovascular: Normal rate, regular rhythm and normal heart sounds.   Pulmonary/Chest: Effort normal and breath sounds normal.  Abdominal: Soft. Bowel sounds are normal.  Lymphadenopathy:    She has no cervical adenopathy.  Neurological: She is alert and oriented to person, place, and time.  Skin: Skin is warm and dry.  Psychiatric: She has a normal mood and affect. Her behavior is normal. Judgment and thought content normal.   BP 107/75  Pulse 67  Temp(Src) 98.7 F (37.1 C) (Oral)  Ht 5\' 1"  (1.549 m)  Wt 139 lb (63.05 kg)  BMI 26.28 kg/m2         Assessment & Plan:   1. Depression   2. Migraines   3. Generalized anxiety disorder   4. Tension headache    No orders of the defined types were placed in this encounter.   Meds ordered this encounter  Medications  . escitalopram (LEXAPRO) 20 MG tablet    Sig: Take 1 tablet (20 mg total) by mouth daily.    Dispense:  30 tablet    Refill:  5    Order Specific Question:  Supervising  Provider    Answer:  Ernestina PennaMOORE, DONALD W [[id number]]  . butalbital-acetaminophen-caffeine (FIORICET, ESGIC) 50-325-40 MG per tablet    Sig: Take 1 tablet by mouth 2 (two) times daily as needed for headache.    Dispense:  21 tablet    Refill:  0    Order Specific Question:  Supervising Provider    Answer:  Ernestina PennaMOORE, DONALD W [[id number]]   Patient will make an appointment for  CPE on ay out today Labs pending Health maintenance reviewed Diet and exercise encouraged Continue all meds Follow up  In 3 months  Mary-Margaret Daphine DeutscherMartin, FNP

## 2014-03-04 NOTE — Patient Instructions (Signed)
Stress Management Stress is a state of physical or mental tension that often results from changes in your life or normal routine. Some common causes of stress are:  Death of a loved one.  Injuries or severe illnesses.  Getting fired or changing jobs.  Moving into a new home. Other causes may be:  Sexual problems.  Business or financial losses.  Taking on a large debt.  Regular conflict with someone at home or at work.  Constant tiredness from lack of sleep. It is not just bad things that are stressful. It may be stressful to:  Win the lottery.  Get married.  Buy a new car. The amount of stress that can be easily tolerated varies from person to person. Changes generally cause stress, regardless of the types of change. Too much stress can affect your health. It may lead to physical or emotional problems. Too little stress (boredom) may also become stressful. SUGGESTIONS TO REDUCE STRESS:  Talk things over with your family and friends. It often is helpful to share your concerns and worries. If you feel your problem is serious, you may want to get help from a professional counselor.  Consider your problems one at a time instead of lumping them all together. Trying to take care of everything at once may seem impossible. List all the things you need to do and then start with the most important one. Set a goal to accomplish 2 or 3 things each day. If you expect to do too many in a single day you will naturally fail, causing you to feel even more stressed.  Do not use alcohol or drugs to relieve stress. Although you may feel better for a short time, they do not remove the problems that caused the stress. They can also be habit forming.  Exercise regularly - at least 3 times per week. Physical exercise can help to relieve that "uptight" feeling and will relax you.  The shortest distance between despair and hope is often a good night's sleep.  Go to bed and get up on time allowing  yourself time for appointments without being rushed.  Take a short "time-out" period from any stressful situation that occurs during the day. Close your eyes and take some deep breaths. Starting with the muscles in your face, tense them, hold it for a few seconds, then relax. Repeat this with the muscles in your neck, shoulders, hand, stomach, back and legs.  Take good care of yourself. Eat a balanced diet and get plenty of rest.  Schedule time for having fun. Take a break from your daily routine to relax. HOME CARE INSTRUCTIONS   Call if you feel overwhelmed by your problems and feel you can no longer manage them on your own.  Return immediately if you feel like hurting yourself or someone else. Document Released: 04/06/2001 Document Revised: 01/03/2012 Document Reviewed: 06/05/2013 [employer] Patient Information 2014 [employer].  

## 2014-03-05 ENCOUNTER — Ambulatory Visit: Payer: BC Managed Care – PPO | Admitting: General Practice

## 2014-05-28 ENCOUNTER — Other Ambulatory Visit: Payer: Self-pay | Admitting: Nurse Practitioner

## 2014-06-03 ENCOUNTER — Other Ambulatory Visit: Payer: Self-pay | Admitting: Family Medicine

## 2014-06-04 NOTE — Telephone Encounter (Signed)
Last ov 5/15. 

## 2014-06-11 ENCOUNTER — Other Ambulatory Visit: Payer: BC Managed Care – PPO | Admitting: Nurse Practitioner

## 2014-08-20 ENCOUNTER — Encounter: Payer: Self-pay | Admitting: Nurse Practitioner

## 2014-08-20 ENCOUNTER — Ambulatory Visit (INDEPENDENT_AMBULATORY_CARE_PROVIDER_SITE_OTHER): Payer: BC Managed Care – PPO | Admitting: Nurse Practitioner

## 2014-08-20 VITALS — BP 126/78 | HR 76 | Temp 97.3°F | Ht 61.0 in | Wt 139.0 lb

## 2014-08-20 DIAGNOSIS — F329 Major depressive disorder, single episode, unspecified: Secondary | ICD-10-CM

## 2014-08-20 DIAGNOSIS — Z01419 Encounter for gynecological examination (general) (routine) without abnormal findings: Secondary | ICD-10-CM

## 2014-08-20 DIAGNOSIS — N812 Incomplete uterovaginal prolapse: Secondary | ICD-10-CM

## 2014-08-20 DIAGNOSIS — G43111 Migraine with aura, intractable, with status migrainosus: Secondary | ICD-10-CM

## 2014-08-20 DIAGNOSIS — Z0000 Encounter for general adult medical examination without abnormal findings: Secondary | ICD-10-CM

## 2014-08-20 DIAGNOSIS — F32A Depression, unspecified: Secondary | ICD-10-CM

## 2014-08-20 LAB — POCT URINALYSIS DIPSTICK
BILIRUBIN UA: NEGATIVE
Glucose, UA: NEGATIVE
KETONES UA: NEGATIVE
Leukocytes, UA: NEGATIVE
Nitrite, UA: NEGATIVE
PH UA: 8
Protein, UA: NEGATIVE
RBC UA: NEGATIVE
Spec Grav, UA: <=1.005
Urobilinogen, UA: NEGATIVE

## 2014-08-20 LAB — POCT UA - MICROSCOPIC ONLY
BACTERIA, U MICROSCOPIC: NEGATIVE
CASTS, UR, LPF, POC: NEGATIVE
CRYSTALS, UR, HPF, POC: NEGATIVE
MUCUS UA: NEGATIVE
RBC, URINE, MICROSCOPIC: NEGATIVE
WBC, Ur, HPF, POC: NEGATIVE
Yeast, UA: NEGATIVE

## 2014-08-20 MED ORDER — ESCITALOPRAM OXALATE 20 MG PO TABS: ORAL_TABLET | ORAL | Status: DC

## 2014-08-20 NOTE — Progress Notes (Signed)
   Subjective:    Patient ID: Meghan Caldwell, female    DOB: [birth year], 43 y.o.   MRN: [id number]  HPI Patient here today for annual physical exam with pap- Her current medical problems include: -depression- currently on lexapro daily- helps with her anxiety -migraines- has 1-2 a month- takes fioricet which hwlps relieve her headaches.    Review of Systems  Constitutional: Negative.   HENT: Positive for congestion, postnasal drip and rhinorrhea.   Respiratory: Positive for cough. Choking: slight.   Cardiovascular: Negative.   Genitourinary: Negative.   Neurological: Negative.   Psychiatric/Behavioral: Negative.   All other systems reviewed and are negative.      Objective:   Physical Exam  Constitutional: She is oriented to person, place, and time. She appears well-developed and well-nourished.  HENT:  Head: Normocephalic.  Right Ear: Hearing, tympanic membrane, external ear and ear canal normal.  Left Ear: Hearing, tympanic membrane, external ear and ear canal normal.  Nose: Nose normal.  Mouth/Throat: Uvula is midline and oropharynx is clear and moist.  Eyes: Conjunctivae and EOM are normal. Pupils are equal, round, and reactive to light.  Neck: Normal range of motion and full passive range of motion without pain. Neck supple. No JVD present. Carotid bruit is not present. No mass and no thyromegaly present.  Cardiovascular: Normal rate, normal heart sounds and intact distal pulses.   No murmur heard. Pulmonary/Chest: Effort normal and breath sounds normal.  Abdominal: Soft. Bowel sounds are normal. She exhibits no mass. There is no tenderness.  Genitourinary: Vagina normal and uterus normal. No breast swelling, tenderness, discharge or bleeding.  bimanual exam-No adnexal masses or tenderness. Cervical  Os parous and pink- no discharge- uterine prolapse at station II  Musculoskeletal: Normal range of motion.  Lymphadenopathy:    She has no cervical adenopathy.    Neurological: She is alert and oriented to person, place, and time.  Skin: Skin is warm and dry.  Psychiatric: She has a normal mood and affect. Her behavior is normal. Judgment and thought content normal.   BP 126/78  Pulse 76  Temp(Src) 97.3 F (36.3 C) (Oral)  Ht $R'5\' 1"'td$  (1.549 m)  Wt 139 lb (63.05 kg)  BMI 26.28 kg/m2        Assessment & Plan:  1. Annual physical exam - POCT urinalysis dipstick - POCT UA - Microscopic Only  2. Encounter for routine gynecological examination  3. Intractable migraine with aura with status migrainosus Avoid caffeine  4. Depression Stress management - escitalopram (LEXAPRO) 20 MG tablet; TAKE 1 TABLET (20 MG TOTAL) BY MOUTH DAILY.  Dispense: 30 tablet; Refill: 5  5. Uterine prolapse Will watch- if starts bothering her she will call for GYN referral  Orders Placed This Encounter  Procedures  . CMP14+EGFR  . NMR, lipoprofile  . Thyroid Panel With TSH  . Vit D  25 hydroxy (rtn osteoporosis monitoring)  . POCT urinalysis dipstick  . POCT UA - Microscopic Only  . POCT CBC    Labs pending Health maintenance reviewed Diet and exercise encouraged Continue all meds Follow up  In 6 months   [place], FNP

## 2014-08-20 NOTE — Addendum Note (Signed)
Addended by: Prescott GumLAND, Emonni Depasquale M on: 08/20/2014 03:23 PM   Modules accepted: Orders

## 2014-08-20 NOTE — Patient Instructions (Signed)
Stress and Stress Management Stress is a normal reaction to life events. It is what you feel when life demands more than you are used to or more than you can handle. Some stress can be useful. For example, the stress reaction can help you catch the last bus of the day, study for a test, or meet a deadline at work. But stress that occurs too often or for too long can cause problems. It can affect your emotional health and interfere with relationships and normal daily activities. Too much stress can weaken your immune system and increase your risk for physical illness. If you already have a medical problem, stress can make it worse. CAUSES  All sorts of life events may cause stress. An event that causes stress for one person may not be stressful for another person. Major life events commonly cause stress. These may be positive or negative. Examples include losing your job, moving into a new home, getting married, having a baby, or losing a loved one. Less obvious life events may also cause stress, especially if they occur day after day or in combination. Examples include working long hours, driving in traffic, caring for children, being in debt, or being in a difficult relationship. SIGNS AND SYMPTOMS Stress may cause emotional symptoms including, the following:  Anxiety. This is feeling worried, afraid, on edge, overwhelmed, or out of control.  Anger. This is feeling irritated or impatient.  Depression. This is feeling sad, down, helpless, or guilty.  Difficulty focusing, remembering, or making decisions. Stress may cause physical symptoms, including the following:   Aches and pains. These may affect your head, neck, back, stomach, or other areas of your body.  Tight muscles or clenched jaw.  Low energy or trouble sleeping. Stress may cause unhealthy behaviors, including the following:   Eating to feel better (overeating) or skipping meals.  Sleeping too little, too much, or both.  Working  too much or putting off tasks (procrastination).  Smoking, drinking alcohol, or using drugs to feel better. DIAGNOSIS  Stress is diagnosed through an assessment by your health care provider. Your health care provider will ask questions about your symptoms and any stressful life events.Your health care provider will also ask about your medical history and may order blood tests or other tests. Certain medical conditions and medicine can cause physical symptoms similar to stress. Mental illness can cause emotional symptoms and unhealthy behaviors similar to stress. Your health care provider may refer you to a mental health professional for further evaluation.  TREATMENT  Stress management is the recommended treatment for stress.The goals of stress management are reducing stressful life events and coping with stress in healthy ways.  Techniques for reducing stressful life events include the following:  Stress identification. Self-monitor for stress and identify what causes stress for you. These skills may help you to avoid some stressful events.  Time management. Set your priorities, keep a calendar of events, and learn to say "no." These tools can help you avoid making too many commitments. Techniques for coping with stress include the following:  Rethinking the problem. Try to think realistically about stressful events rather than ignoring them or overreacting. Try to find the positives in a stressful situation rather than focusing on the negatives.  Exercise. Physical exercise can release both physical and emotional tension. The key is to find a form of exercise you enjoy and do it regularly.  Relaxation techniques. These relax the body and mind. Examples include yoga, meditation, tai chi, biofeedback, deep  breathing, progressive muscle relaxation, listening to music, being out in nature, journaling, and other hobbies. Again, the key is to find one or more that you enjoy and can do  regularly.  Healthy lifestyle. Eat a balanced diet, get plenty of sleep, and do not smoke. Avoid using alcohol or drugs to relax.  Strong support network. Spend time with family, friends, or other people you enjoy being around.Express your feelings and talk things over with someone you trust. Counseling or talktherapy with a mental health professional may be helpful if you are having difficulty managing stress on your own. Medicine is typically not recommended for the treatment of stress.Talk to your health care provider if you think you need medicine for symptoms of stress. HOME CARE INSTRUCTIONS  Keep all follow-up visits as directed by your health care provider.  Take all medicines as directed by your health care provider. SEEK MEDICAL CARE IF:  Your symptoms get worse or you start having new symptoms.  You feel overwhelmed by your problems and can no longer manage them on your own. SEEK IMMEDIATE MEDICAL CARE IF:  You feel like hurting yourself or someone else. Document Released: 04/06/2001 Document Revised: 02/25/2014 Document Reviewed: 06/05/2013 Exit[employer] Patient Information 2015 [employer]. This information is not intended to replace advice given to you by your health care provider. Make sure you discuss any questions you have with your health care provider.  

## 2014-08-21 LAB — PAP IG W/ RFLX HPV ASCU: PAP SMEAR COMMENT: 0

## 2014-08-21 LAB — CBC WITH DIFFERENTIAL
BASOS ABS: 0.1 10*3/uL (ref 0.0–0.2)
Basos: 1 %
EOS ABS: 0.3 10*3/uL (ref 0.0–0.4)
EOS: 2 %
HCT: 42.5 % (ref 34.0–46.6)
Hemoglobin: 14.6 g/dL (ref 11.1–15.9)
IMMATURE GRANS (ABS): 0 10*3/uL (ref 0.0–0.1)
IMMATURE GRANULOCYTES: 0 %
LYMPHS ABS: 3.2 10*3/uL — AB (ref 0.7–3.1)
Lymphs: 26 %
MCH: 33 pg (ref 26.6–33.0)
MCHC: 34.4 g/dL (ref 31.5–35.7)
MCV: 96 fL (ref 79–97)
MONOS ABS: 0.9 10*3/uL (ref 0.1–0.9)
Monocytes: 7 %
Neutrophils Absolute: 8.1 10*3/uL — ABNORMAL HIGH (ref 1.4–7.0)
Neutrophils Relative %: 64 %
PLATELETS: 290 10*3/uL (ref 150–379)
RBC: 4.43 x10E6/uL (ref 3.77–5.28)
RDW: 13.7 % (ref 12.3–15.4)
WBC: 12.5 10*3/uL — ABNORMAL HIGH (ref 3.4–10.8)

## 2014-08-21 LAB — VITAMIN D 25 HYDROXY (VIT D DEFICIENCY, FRACTURES): Vit D, 25-Hydroxy: 24.9 ng/mL — ABNORMAL LOW (ref 30.0–100.0)

## 2014-08-21 LAB — CMP14+EGFR
ALBUMIN: 4.2 g/dL (ref 3.5–5.5)
ALK PHOS: 107 IU/L (ref 39–117)
ALT: 16 IU/L (ref 0–32)
AST: 15 IU/L (ref 0–40)
Albumin/Globulin Ratio: 1.3 (ref 1.1–2.5)
BILIRUBIN TOTAL: 0.3 mg/dL (ref 0.0–1.2)
BUN / CREAT RATIO: 10 (ref 9–23)
BUN: 8 mg/dL (ref 6–24)
CO2: 23 mmol/L (ref 18–29)
Calcium: 9.4 mg/dL (ref 8.7–10.2)
Chloride: 96 mmol/L — ABNORMAL LOW (ref 97–108)
Creatinine, Ser: 0.84 mg/dL (ref 0.57–1.00)
GFR calc non Af Amer: 85 mL/min/{1.73_m2} (ref >59)
GFR, EST AFRICAN AMERICAN: 98 mL/min/{1.73_m2} (ref >59)
Globulin, Total: 3.2 g/dL (ref 1.5–4.5)
Glucose: 85 mg/dL (ref 65–99)
Potassium: 4.5 mmol/L (ref 3.5–5.2)
SODIUM: 137 mmol/L (ref 134–144)
Total Protein: 7.4 g/dL (ref 6.0–8.5)

## 2014-08-21 LAB — NMR, LIPOPROFILE
CHOLESTEROL: 224 mg/dL — AB (ref 100–199)
HDL Cholesterol by NMR: 45 mg/dL (ref >39)
HDL Particle Number: 28.1 umol/L — ABNORMAL LOW (ref >=30.5)
LDL Particle Number: 1771 nmol/L — ABNORMAL HIGH (ref <1000)
LDL SIZE: 20.7 nm (ref >20.5)
LDL-C: 134 mg/dL — AB (ref 0–99)
LP-IR SCORE: 70 — AB (ref <=45)
Small LDL Particle Number: 1012 nmol/L — ABNORMAL HIGH (ref <=527)
Triglycerides by NMR: 227 mg/dL — ABNORMAL HIGH (ref 0–149)

## 2014-08-21 LAB — THYROID PANEL WITH TSH
FREE THYROXINE INDEX: 1.9 (ref 1.2–4.9)
T3 Uptake Ratio: 26 % (ref 24–39)
T4, Total: 7.4 ug/dL (ref 4.5–12.0)
TSH: 1.04 u[IU]/mL (ref 0.450–4.500)

## 2014-08-23 ENCOUNTER — Telehealth: Payer: Self-pay | Admitting: *Deleted

## 2015-02-20 ENCOUNTER — Ambulatory Visit: Payer: BC Managed Care – PPO | Admitting: Nurse Practitioner

## 2015-05-27 ENCOUNTER — Encounter (INDEPENDENT_AMBULATORY_CARE_PROVIDER_SITE_OTHER): Payer: Self-pay

## 2015-05-27 ENCOUNTER — Ambulatory Visit (INDEPENDENT_AMBULATORY_CARE_PROVIDER_SITE_OTHER): Payer: BLUE CROSS/BLUE SHIELD | Admitting: Family

## 2015-05-27 ENCOUNTER — Other Ambulatory Visit: Payer: Self-pay | Admitting: Nurse Practitioner

## 2015-05-27 ENCOUNTER — Encounter: Payer: Self-pay | Admitting: Family

## 2015-05-27 VITALS — BP 106/77 | HR 71 | Temp 98.3°F | Ht 61.0 in | Wt 145.2 lb

## 2015-05-27 DIAGNOSIS — F32A Depression, unspecified: Secondary | ICD-10-CM

## 2015-05-27 DIAGNOSIS — F411 Generalized anxiety disorder: Secondary | ICD-10-CM | POA: Diagnosis not present

## 2015-05-27 DIAGNOSIS — G43111 Migraine with aura, intractable, with status migrainosus: Secondary | ICD-10-CM | POA: Diagnosis not present

## 2015-05-27 DIAGNOSIS — F329 Major depressive disorder, single episode, unspecified: Secondary | ICD-10-CM | POA: Diagnosis not present

## 2015-05-27 MED ORDER — DULOXETINE HCL 30 MG PO CPEP: 30.0000 mg | ORAL_CAPSULE | Freq: Every day | ORAL | Status: DC

## 2015-05-27 MED ORDER — TOPIRAMATE 25 MG PO TABS: ORAL_TABLET | ORAL | Status: DC

## 2015-05-27 NOTE — Progress Notes (Signed)
Subjective:    Patient ID: Meghan Caldwell, female    DOB: 09/01/1971, 44 y.o.   MRN: [id number]  Pt presents to the office today for chronic follow up.  Migraine  This is a new problem. The current episode started more than 1 year ago. Episode frequency: Pt states she gets a headache everyday and a true migraine every few weeks. The problem has been unchanged. The pain is located in the frontal and occipital region. The pain does not radiate. The pain quality is similar to prior headaches. The pain is at a severity of 7/10. The pain is moderate. Associated symptoms include nausea, phonophobia, photophobia and vomiting. Pertinent negatives include no back pain, blurred vision, coughing, fever, sinus pressure or sore throat. Exacerbated by: stress. She has tried NSAIDs for the symptoms. The treatment provided moderate relief. Her past medical history is significant for migraine headaches. There is no history of migraines in the family.  Anxiety Presents for follow-up visit. The problem has been waxing and waning. Symptoms include depressed mood, excessive worry, irritability, nausea and nervous/anxious behavior. Patient reports no palpitations, panic or shortness of breath. Symptoms occur most days.   Her past medical history is significant for anxiety/panic attacks and depression. Past treatments include SSRIs.      Review of Systems  Constitutional: Positive for irritability. Negative for fever.  HENT: Negative.  Negative for sinus pressure and sore throat.   Eyes: Positive for photophobia. Negative for blurred vision.  Respiratory: Negative.  Negative for cough and shortness of breath.   Cardiovascular: Negative.  Negative for palpitations.  Gastrointestinal: Positive for nausea and vomiting.  Endocrine: Negative.   Genitourinary: Negative.   Musculoskeletal: Negative.  Negative for back pain.  Neurological: Negative.  Negative for headaches.  Hematological: Negative.     Psychiatric/Behavioral: The patient is nervous/anxious.   All other systems reviewed and are negative.      Objective:   Physical Exam  Constitutional: She is oriented to person, place, and time. She appears well-developed and well-nourished. No distress.  HENT:  Head: Normocephalic and atraumatic.  Right Ear: External ear normal.  Left Ear: External ear normal.  Nose: Nose normal.  Mouth/Throat: Oropharynx is clear and moist.  Eyes: Pupils are equal, round, and reactive to light.  Neck: Normal range of motion. Neck supple. No thyromegaly present.  Cardiovascular: Normal rate, regular rhythm, normal heart sounds and intact distal pulses.   No murmur heard. Pulmonary/Chest: Effort normal and breath sounds normal. No respiratory distress. She has no wheezes.  Abdominal: Soft. Bowel sounds are normal. She exhibits no distension. There is no tenderness.  Musculoskeletal: Normal range of motion. She exhibits no edema or tenderness.  Neurological: She is alert and oriented to person, place, and time. She has normal reflexes. No cranial nerve deficit.  Skin: Skin is warm and dry.  Psychiatric: She has a normal mood and affect. Her behavior is normal. Judgment and thought content normal.  Vitals reviewed.   BP 106/77 mmHg  Pulse 71  Temp(Src) 98.3 F (36.8 C) (Oral)  Ht _0  (1.549 m)  Wt 145 lb 3.2 oz (65.862 kg)  BMI 27.45 kg/m2       Assessment & Plan:  1. Depression -PT started on cymbalta today -Stress management -Pt is caring for mother with dementia and discussed pt may need to get help - DULoxetine (CYMBALTA) 30 MG capsule; Take 1 capsule (30 mg total) by mouth daily.  Dispense: 90 capsule; Refill: 3 - CMP14+EGFR  2.  Intractable migraine with aura with status migrainosus -Diet discussed - topiramate (TOPAMAX) 25 MG tablet; Pt to take 25 BID for one week then increase to 40m BID  Dispense: 90 tablet; Refill: 1 - CMP14+EGFR  3. GAD (generalized anxiety  disorder) -Stress managment - DULoxetine (CYMBALTA) 30 MG capsule; Take 1 capsule (30 mg total) by mouth daily.  Dispense: 90 capsule; Refill: 3 - CMP14+EGFR   Continue all meds Labs pending Health Maintenance reviewed Diet and exercise encouraged RTO 4 weeks to recheck migraine and GAD, Depression  CEvelina Dun FNP

## 2015-05-27 NOTE — Patient Instructions (Signed)
Generalized Anxiety Disorder Generalized anxiety disorder (GAD) is a mental disorder. It interferes with life functions, including relationships, work, and school. GAD is different from normal anxiety, which everyone experiences at some point in their lives in response to specific life events and activities. Normal anxiety actually helps us prepare for and get through these life events and activities. Normal anxiety goes away after the event or activity is over.  GAD causes anxiety that is not necessarily related to specific events or activities. It also causes excess anxiety in proportion to specific events or activities. The anxiety associated with GAD is also difficult to control. GAD can vary from mild to severe. People with severe GAD can have intense waves of anxiety with physical symptoms (panic attacks).  SYMPTOMS The anxiety and worry associated with GAD are difficult to control. This anxiety and worry are related to many life events and activities and also occur more days than not for 6 months or longer. People with GAD also have three or more of the following symptoms (one or more in children):  Restlessness.   Fatigue.  Difficulty concentrating.   Irritability.  Muscle tension.  Difficulty sleeping or unsatisfying sleep. DIAGNOSIS GAD is diagnosed through an assessment by your health care provider. Your health care provider will ask you questions aboutyour mood,physical symptoms, and events in your life. Your health care provider may ask you about your medical history and use of alcohol or drugs, including prescription medicines. Your health care provider may also do a physical exam and blood tests. Certain medical conditions and the use of certain substances can cause symptoms similar to those associated with GAD. Your health care provider may refer you to a mental health specialist for further evaluation. TREATMENT The following therapies are usually used to treat GAD:    Medication. Antidepressant medication usually is prescribed for long-term daily control. Antianxiety medicines may be added in severe cases, especially when panic attacks occur.   Talk therapy (psychotherapy). Certain types of talk therapy can be helpful in treating GAD by providing support, education, and guidance. A form of talk therapy called cognitive behavioral therapy can teach you healthy ways to think about and react to daily life events and activities.  Stress managementtechniques. These include yoga, meditation, and exercise and can be very helpful when they are practiced regularly. A mental health specialist can help determine which treatment is best for you. Some people see improvement with one therapy. However, other people require a combination of therapies. Document Released: 02/05/2013 Document Revised: 02/25/2014 Document Reviewed: 02/05/2013 [employer] Patient Information 2015 [employer]. This information is not intended to replace advice given to you by your health care provider. Make sure you discuss any questions you have with your health care provider. Depression Depression refers to feeling sad, low, down in the dumps, blue, gloomy, or empty. In general, there are two kinds of depression:  Normal sadness or normal grief. This kind of depression is one that we all feel from time to time after upsetting life experiences, such as the loss of a job or the ending of a relationship. This kind of depression is considered normal, is short lived, and resolves within a few days to 2 weeks. Depression experienced after the loss of a loved one (bereavement) often lasts longer than 2 weeks but normally gets better with time.  Clinical depression. This kind of depression lasts longer than normal sadness or normal grief or interferes with your ability to function at home, at work, and in school.   It also interferes with your personal relationships. It affects almost every aspect of your  life. Clinical depression is an illness. Symptoms of depression can also be caused by conditions other than those mentioned above, such as:  Physical illness. Some physical illnesses, including underactive thyroid gland (hypothyroidism), severe anemia, specific types of cancer, diabetes, uncontrolled seizures, heart and lung problems, strokes, and chronic pain are commonly associated with symptoms of depression.  Side effects of some prescription medicine. In some people, certain types of medicine can cause symptoms of depression.  Substance abuse. Abuse of alcohol and illicit drugs can cause symptoms of depression. SYMPTOMS Symptoms of normal sadness and normal grief include the following:  Feeling sad or crying for short periods of time.  Not caring about anything (apathy).  Difficulty sleeping or sleeping too much.  No longer able to enjoy the things you used to enjoy.  Desire to be by oneself all the time (social isolation).  Lack of energy or motivation.  Difficulty concentrating or remembering.  Change in appetite or weight.  Restlessness or agitation. Symptoms of clinical depression include the same symptoms of normal sadness or normal grief and also the following symptoms:  Feeling sad or crying all the time.  Feelings of guilt or worthlessness.  Feelings of hopelessness or helplessness.  Thoughts of suicide or the desire to harm yourself (suicidal ideation).  Loss of touch with reality (psychotic symptoms). Seeing or hearing things that are not real (hallucinations) or having false beliefs about your life or the people around you (delusions and paranoia). DIAGNOSIS  The diagnosis of clinical depression is usually based on how bad the symptoms are and how long they have lasted. Your health care provider will also ask you questions about your medical history and substance use to find out if physical illness, use of prescription medicine, or substance abuse is causing  your depression. Your health care provider may also order blood tests. TREATMENT  Often, normal sadness and normal grief do not require treatment. However, sometimes antidepressant medicine is given for bereavement to ease the depressive symptoms until they resolve. The treatment for clinical depression depends on how bad the symptoms are but often includes antidepressant medicine, counseling with a mental health professional, or both. Your health care provider will help to determine what treatment is best for you. Depression caused by physical illness usually goes away with appropriate medical treatment of the illness. If prescription medicine is causing depression, talk with your health care provider about stopping the medicine, decreasing the dose, or changing to another medicine. Depression caused by the abuse of alcohol or illicit drugs goes away when you stop using these substances. Some adults need professional help in order to stop drinking or using drugs. SEEK IMMEDIATE MEDICAL CARE IF:  You have thoughts about hurting yourself or others.  You lose touch with reality (have psychotic symptoms).  You are taking medicine for depression and have a serious side effect. FOR MORE INFORMATION  National Alliance on Mental Illness: www.nami.[employer] of Mental Health: http://www.maynard.net/ Document Released: 10/08/2000 Document Revised: 02/25/2014 Document Reviewed: 01/10/2012 [facility] Patient Information 2015 [place], [place]. This information is not intended to replace advice given to you by your health care provider. Make sure you discuss any questions you have with your health care provider. Migraine Headache A migraine headache is an intense, throbbing pain on one or both sides of your head. A migraine can last for 30 minutes to several hours. CAUSES  The exact cause of a  migraine headache is not always known. However, a migraine may be caused when nerves in the brain become  irritated and release chemicals that cause inflammation. This causes pain. Certain things may also trigger migraines, such as:  Alcohol.  Smoking.  Stress.  Menstruation.  Aged cheeses.  Foods or drinks that contain nitrates, glutamate, aspartame, or tyramine.  Lack of sleep.  Chocolate.  Caffeine.  Hunger.  Physical exertion.  Fatigue.  Medicines used to treat chest pain (nitroglycerine), birth control pills, estrogen, and some blood pressure medicines. SIGNS AND SYMPTOMS  Pain on one or both sides of your head.  Pulsating or throbbing pain.  Severe pain that prevents daily activities.  Pain that is aggravated by any physical activity.  Nausea, vomiting, or both.  Dizziness.  Pain with exposure to bright lights, loud noises, or activity.  General sensitivity to bright lights, loud noises, or smells. Before you get a migraine, you may get warning signs that a migraine is coming (aura). An aura may include:  Seeing flashing lights.  Seeing bright spots, halos, or zigzag lines.  Having tunnel vision or blurred vision.  Having feelings of numbness or tingling.  Having trouble talking.  Having muscle weakness. DIAGNOSIS  A migraine headache is often diagnosed based on:  Symptoms.  Physical exam.  A CT scan or MRI of your head. These imaging tests cannot diagnose migraines, but they can help rule out other causes of headaches. TREATMENT Medicines may be given for pain and nausea. Medicines can also be given to help prevent recurrent migraines.  HOME CARE INSTRUCTIONS  Only take over-the-counter or prescription medicines for pain or discomfort as directed by your health care provider. The use of long-term narcotics is not recommended.  Lie down in a dark, quiet room when you have a migraine.  Keep a journal to find out what may trigger your migraine headaches. For example, write down:  What you eat and drink.  How much sleep you get.  Any  change to your diet or medicines.  Limit alcohol consumption.  Quit smoking if you smoke.  Get 7-9 hours of sleep, or as recommended by your health care provider.  Limit stress.  Keep lights dim if bright lights bother you and make your migraines worse. SEEK IMMEDIATE MEDICAL CARE IF:   Your migraine becomes severe.  You have a fever.  You have a stiff neck.  You have vision loss.  You have muscular weakness or loss of muscle control.  You start losing your balance or have trouble walking.  You feel faint or pass out.  You have severe symptoms that are different from your first symptoms. MAKE SURE YOU:   Understand these instructions.  Will watch your condition.  Will get help right away if you are not doing well or get worse. Document Released: 10/11/2005 Document Revised: 02/25/2014 Document Reviewed: 06/18/2013 [facility] Patient Information 2015 [place], [place]. This information is not intended to replace advice given to you by your health care provider. Make sure you discuss any questions you have with your health care provider.

## 2015-05-28 LAB — CMP14+EGFR
ALK PHOS: 100 IU/L (ref 39–117)
ALT: 14 IU/L (ref 0–32)
AST: 15 IU/L (ref 0–40)
Albumin/Globulin Ratio: 1.5 (ref 1.1–2.5)
Albumin: 4.3 g/dL (ref 3.5–5.5)
BUN/Creatinine Ratio: 15 (ref 9–23)
BUN: 11 mg/dL (ref 6–24)
Bilirubin Total: 0.4 mg/dL (ref 0.0–1.2)
CHLORIDE: 101 mmol/L (ref 97–108)
CO2: 24 mmol/L (ref 18–29)
Calcium: 8.9 mg/dL (ref 8.7–10.2)
Creatinine, Ser: 0.75 mg/dL (ref 0.57–1.00)
GFR calc Af Amer: 112 mL/min/{1.73_m2} (ref >59)
GFR calc non Af Amer: 97 mL/min/{1.73_m2} (ref >59)
GLOBULIN, TOTAL: 2.8 g/dL (ref 1.5–4.5)
GLUCOSE: 71 mg/dL (ref 65–99)
Potassium: 4.1 mmol/L (ref 3.5–5.2)
SODIUM: 141 mmol/L (ref 134–144)
TOTAL PROTEIN: 7.1 g/dL (ref 6.0–8.5)

## 2015-06-24 ENCOUNTER — Encounter: Payer: Self-pay | Admitting: Family

## 2015-06-24 ENCOUNTER — Ambulatory Visit (INDEPENDENT_AMBULATORY_CARE_PROVIDER_SITE_OTHER): Payer: BLUE CROSS/BLUE SHIELD | Admitting: Family

## 2015-06-24 VITALS — BP 99/72 | HR 65 | Temp 97.7°F | Ht 61.0 in | Wt 143.6 lb

## 2015-06-24 DIAGNOSIS — F329 Major depressive disorder, single episode, unspecified: Secondary | ICD-10-CM | POA: Diagnosis not present

## 2015-06-24 DIAGNOSIS — Z13220 Encounter for screening for lipoid disorders: Secondary | ICD-10-CM | POA: Diagnosis not present

## 2015-06-24 DIAGNOSIS — Z136 Encounter for screening for cardiovascular disorders: Secondary | ICD-10-CM | POA: Diagnosis not present

## 2015-06-24 DIAGNOSIS — F32A Depression, unspecified: Secondary | ICD-10-CM

## 2015-06-24 DIAGNOSIS — F411 Generalized anxiety disorder: Secondary | ICD-10-CM

## 2015-06-24 DIAGNOSIS — IMO0001 Reserved for inherently not codable concepts without codable children: Secondary | ICD-10-CM

## 2015-06-24 DIAGNOSIS — G43111 Migraine with aura, intractable, with status migrainosus: Secondary | ICD-10-CM

## 2015-06-24 MED ORDER — DULOXETINE HCL 60 MG PO CPEP: 60.0000 mg | ORAL_CAPSULE | Freq: Every day | ORAL | Status: DC

## 2015-06-24 MED ORDER — TOPIRAMATE 100 MG PO TABS: 100.0000 mg | ORAL_TABLET | Freq: Two times a day (BID) | ORAL | Status: DC

## 2015-06-24 NOTE — Progress Notes (Signed)
Subjective:    Patient ID: Meghan Caldwell, female    DOB: 1971/08/15, 44 y.o.   MRN: [id number]  Pt presents to the office today to recheck GAD and depression.  Pt states she is feeling "much better".  Depression      The patient presents with depression.  This is a chronic problem.  The current episode started more than 1 year ago.   The onset quality is sudden.   The problem occurs intermittently.  Associated symptoms include fatigue.  Associated symptoms include no helplessness, no hopelessness, does not have insomnia, no restlessness and no headaches.  Past treatments include SSRIs - Selective serotonin reuptake inhibitors.  Compliance with treatment is good.  Past medical history includes anxiety and depression.   Anxiety Presents for follow-up visit. Onset was 1 to 6 months ago. The problem has been waxing and waning. Symptoms include depressed mood, excessive worry and nervous/anxious behavior. Patient reports no insomnia, palpitations, panic, restlessness or shortness of breath. Symptoms occur occasionally. The symptoms are aggravated by family issues.   Her past medical history is significant for anxiety/panic attacks and depression. Past treatments include SSRIs.  Migraine  This is a chronic problem. The current episode started more than 1 year ago. Episode frequency: weekly. The problem has been waxing and waning. The pain is located in the frontal and occipital region. The pain does not radiate. The pain quality is similar to prior headaches. The pain is at a severity of 7/10. The pain is moderate. Associated symptoms include phonophobia and photophobia. Pertinent negatives include no blurred vision, fever or insomnia. Exacerbated by: stress. She has tried NSAIDs for the symptoms. The treatment provided moderate relief. Her past medical history is significant for migraine headaches. There is no history of migraines in the family.      Review of Systems  Constitutional: Positive for  fatigue. Negative for fever.  HENT: Negative.   Eyes: Positive for photophobia. Negative for blurred vision.  Respiratory: Negative.  Negative for shortness of breath.   Cardiovascular: Negative.  Negative for palpitations.  Gastrointestinal: Negative.   Endocrine: Negative.   Genitourinary: Negative.   Musculoskeletal: Negative.   Neurological: Negative.  Negative for headaches.  Hematological: Negative.   Psychiatric/Behavioral: Positive for depression. The patient is nervous/anxious. The patient does not have insomnia.   All other systems reviewed and are negative.      Objective:   Physical Exam  Constitutional: She is oriented to person, place, and time. She appears well-developed and well-nourished. No distress.  HENT:  Head: Normocephalic and atraumatic.  Right Ear: External ear normal.  Left Ear: External ear normal.  Nose: Nose normal.  Mouth/Throat: Oropharynx is clear and moist.  Eyes: Pupils are equal, round, and reactive to light.  Neck: Normal range of motion. Neck supple. No thyromegaly present.  Cardiovascular: Normal rate, regular rhythm, normal heart sounds and intact distal pulses.   No murmur heard. Pulmonary/Chest: Effort normal and breath sounds normal. No respiratory distress. She has no wheezes.  Abdominal: Soft. Bowel sounds are normal. She exhibits no distension. There is no tenderness.  Musculoskeletal: Normal range of motion. She exhibits no edema or tenderness.  Neurological: She is alert and oriented to person, place, and time. She has normal reflexes. No cranial nerve deficit.  Skin: Skin is warm and dry.  Psychiatric: She has a normal mood and affect. Her behavior is normal. Judgment and thought content normal.  Vitals reviewed.    BP 99/72 mmHg  Pulse 65  Temp(Src) 97.7 F (36.5 C) (Oral)  Ht $R'5\' 1"'Fx$  (1.549 m)  Wt 143 lb 9.6 oz (65.137 kg)  BMI 27.15 kg/m2      Assessment & Plan:  1. GAD (generalized anxiety disorder) Pt's cymbalta  increased to 60 mg daily and pt's lexapro dc -Stress management discussed - CMP14+EGFR - DULoxetine (CYMBALTA) 60 MG capsule; Take 1 capsule (60 mg total) by mouth daily.  Dispense: 90 capsule; Refill: 3  2. Depression Pt's cymbalta increased to 60 mg daily and pt's lexapro dc -Stress management discussed - CMP14+EGFR - DULoxetine (CYMBALTA) 60 MG capsule; Take 1 capsule (60 mg total) by mouth daily.  Dispense: 90 capsule; Refill: 3  3. Intractable migraine with aura with status migrainosus -Pt's topamax increased to 100 mg BID from 50 mg BID - topiramate (TOPAMAX) 100 MG tablet; Take 1 tablet (100 mg total) by mouth 2 (two) times daily.  Dispense: 90 tablet; Refill: 2 - CMP14+EGFR  4. Encounter for cholesteral screening for cardiovascular disease - CMP14+EGFR - Lipid panel   Continue all meds Labs pending Health Maintenance reviewed-Pt to schedule mammogram Diet and exercise encouraged RTO 3 months  Evelina Dun, FNP

## 2015-06-24 NOTE — Patient Instructions (Addendum)
Migraine Headache A migraine headache is an intense, throbbing pain on one or both sides of your head. A migraine can last for 30 minutes to several hours. CAUSES  The exact cause of a migraine headache is not always known. However, a migraine may be caused when nerves in the brain become irritated and release chemicals that cause inflammation. This causes pain. Certain things may also trigger migraines, such as:  Alcohol.  Smoking.  Stress.  Menstruation.  Aged cheeses.  Foods or drinks that contain nitrates, glutamate, aspartame, or tyramine.  Lack of sleep.  Chocolate.  Caffeine.  Hunger.  Physical exertion.  Fatigue.  Medicines used to treat chest pain (nitroglycerine), birth control pills, estrogen, and some blood pressure medicines. SIGNS AND SYMPTOMS  Pain on one or both sides of your head.  Pulsating or throbbing pain.  Severe pain that prevents daily activities.  Pain that is aggravated by any physical activity.  Nausea, vomiting, or both.  Dizziness.  Pain with exposure to bright lights, loud noises, or activity.  General sensitivity to bright lights, loud noises, or smells. Before you get a migraine, you may get warning signs that a migraine is coming (aura). An aura may include:  Seeing flashing lights.  Seeing bright spots, halos, or zigzag lines.  Having tunnel vision or blurred vision.  Having feelings of numbness or tingling.  Having trouble talking.  Having muscle weakness. DIAGNOSIS  A migraine headache is often diagnosed based on:  Symptoms.  Physical exam.  A CT scan or MRI of your head. These imaging tests cannot diagnose migraines, but they can help rule out other causes of headaches. TREATMENT Medicines may be given for pain and nausea. Medicines can also be given to help prevent recurrent migraines.  HOME CARE INSTRUCTIONS  Only take over-the-counter or prescription medicines for pain or discomfort as directed by your  health care provider. The use of long-term narcotics is not recommended.  Lie down in a dark, quiet room when you have a migraine.  Keep a journal to find out what may trigger your migraine headaches. For example, write down:  What you eat and drink.  How much sleep you get.  Any change to your diet or medicines.  Limit alcohol consumption.  Quit smoking if you smoke.  Get 7-9 hours of sleep, or as recommended by your health care provider.  Limit stress.  Keep lights dim if bright lights bother you and make your migraines worse. SEEK IMMEDIATE MEDICAL CARE IF:   Your migraine becomes severe.  You have a fever.  You have a stiff neck.  You have vision loss.  You have muscular weakness or loss of muscle control.  You start losing your balance or have trouble walking.  You feel faint or pass out.  You have severe symptoms that are different from your first symptoms. MAKE SURE YOU:   Understand these instructions.  Will watch your condition.  Will get help right away if you are not doing well or get worse. Document Released: 10/11/2005 Document Revised: 02/25/2014 Document Reviewed: 06/18/2013 [facility] Patient Information 2015 [place], [place]. This information is not intended to replace advice given to you by your health care provider. Make sure you discuss any questions you have with your health care provider. Generalized Anxiety Disorder Generalized anxiety disorder (GAD) is a mental disorder. It interferes with life functions, including relationships, work, and school. GAD is different from normal anxiety, which everyone experiences at some point in their lives in response to specific  life events and activities. Normal anxiety actually helps [place] prepare for and get through these life events and activities. Normal anxiety goes away after the event or activity is over.  GAD causes anxiety that is not necessarily related to specific events or activities. It also causes  excess anxiety in proportion to specific events or activities. The anxiety associated with GAD is also difficult to control. GAD can vary from mild to severe. People with severe GAD can have intense waves of anxiety with physical symptoms (panic attacks).  SYMPTOMS The anxiety and worry associated with GAD are difficult to control. This anxiety and worry are related to many life events and activities and also occur more days than not for 6 months or longer. People with GAD also have three or more of the following symptoms (one or more in children):  Restlessness.   Fatigue.  Difficulty concentrating.   Irritability.  Muscle tension.  Difficulty sleeping or unsatisfying sleep. DIAGNOSIS GAD is diagnosed through an assessment by your health care provider. Your health care provider will ask you questions aboutyour mood,physical symptoms, and events in your life. Your health care provider may ask you about your medical history and use of alcohol or drugs, including prescription medicines. Your health care provider may also do a physical exam and blood tests. Certain medical conditions and the use of certain substances can cause symptoms similar to those associated with GAD. Your health care provider may refer you to a mental health specialist for further evaluation. TREATMENT The following therapies are usually used to treat GAD:   Medication. Antidepressant medication usually is prescribed for long-term daily control. Antianxiety medicines may be added in severe cases, especially when panic attacks occur.   Talk therapy (psychotherapy). Certain types of talk therapy can be helpful in treating GAD by providing support, education, and guidance. A form of talk therapy called cognitive behavioral therapy can teach you healthy ways to think about and react to daily life events and activities.  Stress managementtechniques. These include yoga, meditation, and exercise and can be very helpful when  they are practiced regularly. A mental health specialist can help determine which treatment is best for you. Some people see improvement with one therapy. However, other people require a combination of therapies. Document Released: 02/05/2013 Document Revised: 02/25/2014 Document Reviewed: 02/05/2013 [facility] Patient Information 2015 [place], [place]. This information is not intended to replace advice given to you by your health care provider. Make sure you discuss any questions you have with your health care provider. Depression Depression refers to feeling sad, low, down in the dumps, blue, gloomy, or empty. In general, there are two kinds of depression:  Normal sadness or normal grief. This kind of depression is one that we all feel from time to time after upsetting life experiences, such as the loss of a job or the ending of a relationship. This kind of depression is considered normal, is short lived, and resolves within a few days to 2 weeks. Depression experienced after the loss of a loved one (bereavement) often lasts longer than 2 weeks but normally gets better with time.  Clinical depression. This kind of depression lasts longer than normal sadness or normal grief or interferes with your ability to function at home, at work, and in school. It also interferes with your personal relationships. It affects almost every aspect of your life. Clinical depression is an illness. Symptoms of depression can also be caused by conditions other than those mentioned above, such as:  Physical illness.  Some physical illnesses, including underactive thyroid gland (hypothyroidism), severe anemia, specific types of cancer, diabetes, uncontrolled seizures, heart and lung problems, strokes, and chronic pain are commonly associated with symptoms of depression.  Side effects of some prescription medicine. In some people, certain types of medicine can cause symptoms of depression.  Substance abuse. Abuse of alcohol  and illicit drugs can cause symptoms of depression. SYMPTOMS Symptoms of normal sadness and normal grief include the following:  Feeling sad or crying for short periods of time.  Not caring about anything (apathy).  Difficulty sleeping or sleeping too much.  No longer able to enjoy the things you used to enjoy.  Desire to be by oneself all the time (social isolation).  Lack of energy or motivation.  Difficulty concentrating or remembering.  Change in appetite or weight.  Restlessness or agitation. Symptoms of clinical depression include the same symptoms of normal sadness or normal grief and also the following symptoms:  Feeling sad or crying all the time.  Feelings of guilt or worthlessness.  Feelings of hopelessness or helplessness.  Thoughts of suicide or the desire to harm yourself (suicidal ideation).  Loss of touch with reality (psychotic symptoms). Seeing or hearing things that are not real (hallucinations) or having false beliefs about your life or the people around you (delusions and paranoia). DIAGNOSIS  The diagnosis of clinical depression is usually based on how bad the symptoms are and how long they have lasted. Your health care provider will also ask you questions about your medical history and substance use to find out if physical illness, use of prescription medicine, or substance abuse is causing your depression. Your health care provider may also order blood tests. TREATMENT  Often, normal sadness and normal grief do not require treatment. However, sometimes antidepressant medicine is given for bereavement to ease the depressive symptoms until they resolve. The treatment for clinical depression depends on how bad the symptoms are but often includes antidepressant medicine, counseling with a mental health professional, or both. Your health care provider will help to determine what treatment is best for you. Depression caused by physical illness usually goes away  with appropriate medical treatment of the illness. If prescription medicine is causing depression, talk with your health care provider about stopping the medicine, decreasing the dose, or changing to another medicine. Depression caused by the abuse of alcohol or illicit drugs goes away when you stop using these substances. Some adults need professional help in order to stop drinking or using drugs. SEEK IMMEDIATE MEDICAL CARE IF:  You have thoughts about hurting yourself or others.  You lose touch with reality (have psychotic symptoms).  You are taking medicine for depression and have a serious side effect. FOR MORE INFORMATION  National Alliance on Mental Illness: www.nami.[employer] of Mental Health: http://www.maynard.net/ Document Released: 10/08/2000 Document Revised: 02/25/2014 Document Reviewed: 01/10/2012 [facility] Patient Information 2015 [place], [place]. This information is not intended to replace advice given to you by your health care provider. Make sure you discuss any questions you have with your health care provider.

## 2015-06-25 ENCOUNTER — Other Ambulatory Visit: Payer: Self-pay | Admitting: Family

## 2015-06-25 DIAGNOSIS — E785 Hyperlipidemia, unspecified: Secondary | ICD-10-CM

## 2015-06-25 LAB — CMP14+EGFR
A/G RATIO: 1.5 (ref 1.1–2.5)
ALK PHOS: 93 IU/L (ref 39–117)
ALT: 9 IU/L (ref 0–32)
AST: 14 IU/L (ref 0–40)
Albumin: 4.1 g/dL (ref 3.5–5.5)
BUN/Creatinine Ratio: 11 (ref 9–23)
BUN: 9 mg/dL (ref 6–24)
Bilirubin Total: 0.4 mg/dL (ref 0.0–1.2)
CALCIUM: 8.8 mg/dL (ref 8.7–10.2)
CO2: 18 mmol/L (ref 18–29)
Chloride: 102 mmol/L (ref 97–108)
Creatinine, Ser: 0.84 mg/dL (ref 0.57–1.00)
GFR calc Af Amer: 98 mL/min/{1.73_m2} (ref >59)
GFR, EST NON AFRICAN AMERICAN: 85 mL/min/{1.73_m2} (ref >59)
Globulin, Total: 2.8 g/dL (ref 1.5–4.5)
Glucose: 90 mg/dL (ref 65–99)
POTASSIUM: 4.2 mmol/L (ref 3.5–5.2)
SODIUM: 137 mmol/L (ref 134–144)
Total Protein: 6.9 g/dL (ref 6.0–8.5)

## 2015-06-25 LAB — LIPID PANEL
CHOLESTEROL TOTAL: 204 mg/dL — AB (ref 100–199)
Chol/HDL Ratio: 4 ratio units (ref 0.0–4.4)
HDL: 51 mg/dL (ref >39)
LDL Calculated: 125 mg/dL — ABNORMAL HIGH (ref 0–99)
TRIGLYCERIDES: 141 mg/dL (ref 0–149)
VLDL Cholesterol Cal: 28 mg/dL (ref 5–40)

## 2015-06-25 MED ORDER — SIMVASTATIN 20 MG PO TABS: 20.0000 mg | ORAL_TABLET | Freq: Every day | ORAL | Status: DC

## 2015-07-05 ENCOUNTER — Other Ambulatory Visit: Payer: Self-pay | Admitting: Nurse Practitioner

## 2015-08-15 ENCOUNTER — Ambulatory Visit (INDEPENDENT_AMBULATORY_CARE_PROVIDER_SITE_OTHER): Payer: BLUE CROSS/BLUE SHIELD | Admitting: Pediatrics

## 2015-08-15 ENCOUNTER — Encounter: Payer: Self-pay | Admitting: Pediatrics

## 2015-08-15 VITALS — BP 121/83 | HR 74 | Temp 97.3°F | Ht 61.0 in | Wt 144.0 lb

## 2015-08-15 DIAGNOSIS — J018 Other acute sinusitis: Secondary | ICD-10-CM | POA: Diagnosis not present

## 2015-08-15 MED ORDER — AZITHROMYCIN 250 MG PO TABS: ORAL_TABLET | ORAL | Status: DC

## 2015-08-15 NOTE — Progress Notes (Signed)
    Subjective:    Patient ID: Meghan Caldwell, female    DOB: January 30, 1971, 44 y.o.   MRN: [id number]  CC: cough congestion  HPI: Meghan PootJada D Caldwell is a 44 y.o. female presenting on 08/15/2015 for Cough; Nasal Congestion; Shortness of Breath; and Chest Pain  Has been coughing for some time, several weeks.  SOB with cough, feels tight Feels tired Non-exertional chest pain associated with coughing, easily fatgiued, get hot and cold, clammy over past week. No fevers. Not a smoker, has never had lung problems Husband smokes heavily inside.  Relevant past medical, surgical, family and social history reviewed and updated as indicated. Interim medical history since our last visit reviewed. Allergies and medications reviewed and updated.   ROS: Per HPI unless specifically indicated above  Past Medical History Patient Active Problem List   Diagnosis Date Noted  . Hyperlipidemia 06/25/2015  . GAD (generalized anxiety disorder) 05/27/2015  . Depression 03/04/2014  . Migraines 03/04/2014  . Urticaria of unknown origin 11/22/2013    Current Outpatient Prescriptions  Medication Sig Dispense Refill  . DULoxetine (CYMBALTA) 60 MG capsule Take 1 capsule (60 mg total) by mouth daily. 90 capsule 3  . simvastatin (ZOCOR) 20 MG tablet Take 1 tablet (20 mg total) by mouth daily. 90 tablet 1  . topiramate (TOPAMAX) 100 MG tablet Take 1 tablet (100 mg total) by mouth 2 (two) times daily. 90 tablet 2  . azithromycin (ZITHROMAX) 250 MG tablet Take 2 the first day and then one each day after. 6 tablet 0   No current facility-administered medications for this visit.       Objective:    BP 121/83 mmHg  Pulse 74  Temp(Src) 97.3 F (36.3 C) (Oral)  Ht 5\' 1"  (1.549 m)  Wt 144 lb (65.318 kg)  BMI 27.22 kg/m2  SpO2 97%  Wt Readings from Last 3 Encounters:  08/15/15 144 lb (65.318 kg)  06/24/15 143 lb 9.6 oz (65.137 kg)  05/27/15 145 lb 3.2 oz (65.862 kg)    Gen: NAD, alert, cooperative with  exam, NCAT EYES: EOMI, no scleral injection or icterus ENT:  TMs gray, some effusion b/l, OP with ild erythema, frontal sinus tenderness CV: NRRR, normal S1/S2, no murmur Resp: CTABL, no wheezes, normal WOB Abd: +BS, soft, mildly tender with palpation, ND. no guarding or organomegaly Neuro: Alert and oriented     Assessment & Plan:   Meghan Caldwell was seen today for acute sinusitis. Discussed symptomatic care.  Diagnoses and all orders for this visit:  Other acute sinusitis -     azithromycin (ZITHROMAX) 250 MG tablet; Take 2 the first day and then one each day after.    Follow up plan: Return if symptoms worsen or fail to improve.  Rex Krasarol Fermina Mishkin, MD [facility] 08/15/2015, 9:13 AM

## 2015-08-20 ENCOUNTER — Ambulatory Visit: Payer: BLUE CROSS/BLUE SHIELD | Admitting: Family

## 2015-08-20 ENCOUNTER — Ambulatory Visit: Payer: BLUE CROSS/BLUE SHIELD | Admitting: Pediatrics

## 2015-09-25 ENCOUNTER — Encounter: Payer: Self-pay | Admitting: Family

## 2015-09-25 ENCOUNTER — Ambulatory Visit (INDEPENDENT_AMBULATORY_CARE_PROVIDER_SITE_OTHER): Payer: BLUE CROSS/BLUE SHIELD | Admitting: Family

## 2015-09-25 VITALS — BP 103/72 | HR 71 | Temp 98.8°F | Ht 61.0 in | Wt 148.6 lb

## 2015-09-25 DIAGNOSIS — G43111 Migraine with aura, intractable, with status migrainosus: Secondary | ICD-10-CM

## 2015-09-25 DIAGNOSIS — F32A Depression, unspecified: Secondary | ICD-10-CM

## 2015-09-25 DIAGNOSIS — E785 Hyperlipidemia, unspecified: Secondary | ICD-10-CM

## 2015-09-25 DIAGNOSIS — Z23 Encounter for immunization: Secondary | ICD-10-CM | POA: Diagnosis not present

## 2015-09-25 DIAGNOSIS — F329 Major depressive disorder, single episode, unspecified: Secondary | ICD-10-CM | POA: Diagnosis not present

## 2015-09-25 DIAGNOSIS — F411 Generalized anxiety disorder: Secondary | ICD-10-CM | POA: Diagnosis not present

## 2015-09-25 MED ORDER — BUPROPION HCL ER (XL) 150 MG PO TB24: 150.0000 mg | ORAL_TABLET | Freq: Every day | ORAL | Status: DC

## 2015-09-25 NOTE — Progress Notes (Signed)
Subjective:    Patient ID: Meghan Caldwell, female    DOB: 12/12/1970, 44 y.o.   MRN: [id number]  Pt presents to the office today for chronic follow up.  Anxiety Presents for follow-up visit. Onset was 1 to 6 months ago. The problem has been waxing and waning. Symptoms include depressed mood, excessive worry and nervous/anxious behavior. Patient reports no insomnia, palpitations, panic, restlessness or shortness of breath. Symptoms occur occasionally. The symptoms are aggravated by family issues.   Her past medical history is significant for anxiety/panic attacks and depression. Past treatments include SSRIs.  Hyperlipidemia This is a chronic problem. The current episode started more than 1 year ago. The problem is uncontrolled. Recent lipid tests were reviewed and are high. Pertinent negatives include no shortness of breath. Current antihyperlipidemic treatment includes statins. The current treatment provides moderate improvement of lipids. Risk factors for coronary artery disease include dyslipidemia, female sex and hypertension.  Depression      The patient presents with depression.  This is a chronic problem.  The current episode started more than 1 year ago.   The onset quality is sudden.   The problem occurs intermittently.  Associated symptoms include fatigue.  Associated symptoms include no helplessness, no hopelessness, does not have insomnia, no restlessness and no headaches.  Past treatments include SSRIs - Selective serotonin reuptake inhibitors.  Compliance with treatment is good.  Past medical history includes anxiety and depression.   Migraine  This is a chronic problem. The current episode started more than 1 year ago. Episode frequency: weekly. The problem has been waxing and waning. The pain is located in the frontal and occipital region. The pain does not radiate. The pain quality is similar to prior headaches. The pain is at a severity of 7/10. The pain is moderate. Associated  symptoms include phonophobia and photophobia. Pertinent negatives include no blurred vision, fever or insomnia. Exacerbated by: stress. She has tried NSAIDs for the symptoms. The treatment provided moderate relief. Her past medical history is significant for migraine headaches. There is no history of migraines in the family.      Review of Systems  Constitutional: Positive for fatigue. Negative for fever.  HENT: Negative.   Eyes: Positive for photophobia. Negative for blurred vision.  Respiratory: Negative.  Negative for shortness of breath.   Cardiovascular: Negative.  Negative for palpitations.  Gastrointestinal: Negative.   Endocrine: Negative.   Genitourinary: Negative.   Musculoskeletal: Negative.   Neurological: Negative.  Negative for headaches.  Hematological: Negative.   Psychiatric/Behavioral: Positive for depression. The patient is nervous/anxious. The patient does not have insomnia.   All other systems reviewed and are negative.      Objective:   Physical Exam  Constitutional: She is oriented to person, place, and time. She appears well-developed and well-nourished. No distress.  HENT:  Head: Normocephalic and atraumatic.  Right Ear: External ear normal.  Mouth/Throat: Oropharynx is clear and moist.  Eyes: Pupils are equal, round, and reactive to light.  Neck: Normal range of motion. Neck supple. No thyromegaly present.  Cardiovascular: Normal rate, regular rhythm, normal heart sounds and intact distal pulses.   No murmur heard. Pulmonary/Chest: Effort normal and breath sounds normal. No respiratory distress. She has no wheezes.  Abdominal: Soft. Bowel sounds are normal. She exhibits no distension. There is no tenderness.  Musculoskeletal: Normal range of motion. She exhibits no edema or tenderness.  Neurological: She is alert and oriented to person, place, and time. She has normal reflexes. No  cranial nerve deficit.  Skin: Skin is warm and dry.  Psychiatric: She has  a normal mood and affect. Her behavior is normal. Judgment and thought content normal.  Vitals reviewed.     BP 103/72 mmHg  Pulse 71  Temp(Src) 98.8 F (37.1 C) (Oral)  Ht $R'5\' 1"'ss$  (1.549 m)  Wt 148 lb 9.6 oz (67.405 kg)  BMI 28.09 kg/m2     Assessment & Plan:  1. Intractable migraine with aura with status migrainosus - CMP14+EGFR  2. Depression -Wellbutrin XL 150 mg started today Stress management discussed - CMP14+EGFR - buPROPion (WELLBUTRIN XL) 150 MG 24 hr tablet; Take 1 tablet (150 mg total) by mouth daily.  Dispense: 90 tablet; Refill: 1  3. GAD (generalized anxiety disorder) -Wellbutrin XL 150 mg started today Stress management discussed - CMP14+EGFR - buPROPion (WELLBUTRIN XL) 150 MG 24 hr tablet; Take 1 tablet (150 mg total) by mouth daily.  Dispense: 90 tablet; Refill: 1  4. Hyperlipidemia - CMP14+EGFR - Lipid panel   Continue all meds Labs pending Health Maintenance reviewed-Flu vaccine given today Diet and exercise encouraged RTO 6 months  Evelina Dun, FNP

## 2015-09-25 NOTE — Patient Instructions (Signed)
Health Maintenance, Female Adopting a healthy lifestyle and getting preventive care can go a long way to promote health and wellness. Talk with your health care provider about what schedule of regular examinations is right for you. This is a good chance for you to check in with your provider about disease prevention and staying healthy. In between checkups, there are plenty of things you can do on your own. Experts have done a lot of research about which lifestyle changes and preventive measures are most likely to keep you healthy. Ask your health care provider for more information. WEIGHT AND DIET  Eat a healthy diet  Be sure to include plenty of vegetables, fruits, low-fat dairy products, and lean protein.  Do not eat a lot of foods high in solid fats, added sugars, or salt.  Get regular exercise. This is one of the most important things you can do for your health.  Most adults should exercise for at least 150 minutes each week. The exercise should increase your heart rate and make you sweat (moderate-intensity exercise).  Most adults should also do strengthening exercises at least twice a week. This is in addition to the moderate-intensity exercise.  Maintain a healthy weight  Body mass index (BMI) is a measurement that can be used to identify possible weight problems. It estimates body fat based on height and weight. Your health care provider can help determine your BMI and help you achieve or maintain a healthy weight.  For females 20 years of age and older:   A BMI below 18.5 is considered underweight.  A BMI of 18.5 to 24.9 is normal.  A BMI of 25 to 29.9 is considered overweight.  A BMI of 30 and above is considered obese.  Watch levels of cholesterol and blood lipids  You should start having your blood tested for lipids and cholesterol at 45 years of age, then have this test every 5 years.  You may need to have your cholesterol levels checked more often if:  Your lipid  or cholesterol levels are high.  You are older than 45 years of age.  You are at high risk for heart disease.  CANCER SCREENING   Lung Cancer  Lung cancer screening is recommended for adults 55-80 years old who are at high risk for lung cancer because of a history of smoking.  A yearly low-dose CT scan of the lungs is recommended for people who:  Currently smoke.  Have quit within the past 15 years.  Have at least a 30-pack-year history of smoking. A pack year is smoking an average of one pack of cigarettes a day for 1 year.  Yearly screening should continue until it has been 15 years since you quit.  Yearly screening should stop if you develop a health problem that would prevent you from having lung cancer treatment.  Breast Cancer  Practice breast self-awareness. This means understanding how your breasts normally appear and feel.  It also means doing regular breast self-exams. Let your health care provider know about any changes, no matter how small.  If you are in your 20s or 30s, you should have a clinical breast exam (CBE) by a health care provider every 1-3 years as part of a regular health exam.  If you are 40 or older, have a CBE every year. Also consider having a breast X-ray (mammogram) every year.  If you have a family history of breast cancer, talk to your health care provider about genetic screening.  If you   are at high risk for breast cancer, talk to your health care provider about having an MRI and a mammogram every year.  Breast cancer gene (BRCA) assessment is recommended for women who have family members with BRCA-related cancers. BRCA-related cancers include:  Breast.  Ovarian.  Tubal.  Peritoneal cancers.  Results of the assessment will determine the need for genetic counseling and BRCA1 and BRCA2 testing. Cervical Cancer Your health care provider may recommend that you be screened regularly for cancer of the pelvic organs (ovaries, uterus, and  vagina). This screening involves a pelvic examination, including checking for microscopic changes to the surface of your cervix (Pap test). You may be encouraged to have this screening done every 3 years, beginning at age 21.  For women ages 30-65, health care providers may recommend pelvic exams and Pap testing every 3 years, or they may recommend the Pap and pelvic exam, combined with testing for human papilloma virus (HPV), every 5 years. Some types of HPV increase your risk of cervical cancer. Testing for HPV may also be done on women of any age with unclear Pap test results.  Other health care providers may not recommend any screening for nonpregnant women who are considered low risk for pelvic cancer and who do not have symptoms. Ask your health care provider if a screening pelvic exam is right for you.  If you have had past treatment for cervical cancer or a condition that could lead to cancer, you need Pap tests and screening for cancer for at least 20 years after your treatment. If Pap tests have been discontinued, your risk factors (such as having a new sexual partner) need to be reassessed to determine if screening should resume. Some women have medical problems that increase the chance of getting cervical cancer. In these cases, your health care provider may recommend more frequent screening and Pap tests. Colorectal Cancer  This type of cancer can be detected and often prevented.  Routine colorectal cancer screening usually begins at 45 years of age and continues through 45 years of age.  Your health care provider may recommend screening at an earlier age if you have risk factors for colon cancer.  Your health care provider may also recommend using home test kits to check for hidden blood in the stool.  A small camera at the end of a tube can be used to examine your colon directly (sigmoidoscopy or colonoscopy). This is done to check for the earliest forms of colorectal  cancer.  Routine screening usually begins at age 50.  Direct examination of the colon should be repeated every 5-10 years through 45 years of age. However, you may need to be screened more often if early forms of precancerous polyps or small growths are found. Skin Cancer  Check your skin from head to toe regularly.  Tell your health care provider about any new moles or changes in moles, especially if there is a change in a mole's shape or color.  Also tell your health care provider if you have a mole that is larger than the size of a pencil eraser.  Always use sunscreen. Apply sunscreen liberally and repeatedly throughout the day.  Protect yourself by wearing long sleeves, pants, a wide-brimmed hat, and sunglasses whenever you are outside. HEART DISEASE, DIABETES, AND HIGH BLOOD PRESSURE   High blood pressure causes heart disease and increases the risk of stroke. High blood pressure is more likely to develop in:  People who have blood pressure in the high end   of the normal range (130-139/85-89 mm Hg).  People who are overweight or obese.  People who are African American.  If you are 38-23 years of age, have your blood pressure checked every 3-5 years. If you are 61 years of age or older, have your blood pressure checked every year. You should have your blood pressure measured twice--once when you are at a hospital or clinic, and once when you are not at a hospital or clinic. Record the average of the two measurements. To check your blood pressure when you are not at a hospital or clinic, you can use:  An automated blood pressure machine at a pharmacy.  A home blood pressure monitor.  If you are between 45 years and 39 years old, ask your health care provider if you should take aspirin to prevent strokes.  Have regular diabetes screenings. This involves taking a blood sample to check your fasting blood sugar level.  If you are at a normal weight and have a low risk for diabetes,  have this test once every three years after 45 years of age.  If you are overweight and have a high risk for diabetes, consider being tested at a younger age or more often. PREVENTING INFECTION  Hepatitis B  If you have a higher risk for hepatitis B, you should be screened for this virus. You are considered at high risk for hepatitis B if:  You were born in a country where hepatitis B is common. Ask your health care provider which countries are considered high risk.  Your parents were born in a high-risk country, and you have not been immunized against hepatitis B (hepatitis B vaccine).  You have HIV or AIDS.  You use needles to inject street drugs.  You live with someone who has hepatitis B.  You have had sex with someone who has hepatitis B.  You get hemodialysis treatment.  You take certain medicines for conditions, including cancer, organ transplantation, and autoimmune conditions. Hepatitis C  Blood testing is recommended for:  Everyone born from [birth year] through 1966.  Anyone with known risk factors for hepatitis C. Sexually transmitted infections (STIs)  You should be screened for sexually transmitted infections (STIs) including gonorrhea and chlamydia if:  You are sexually active and are younger than 45 years of age.  You are older than 45 years of age and your health care provider tells you that you are at risk for this type of infection.  Your sexual activity has changed since you were last screened and you are at an increased risk for chlamydia or gonorrhea. Ask your health care provider if you are at risk.  If you do not have HIV, but are at risk, it may be recommended that you take a prescription medicine daily to prevent HIV infection. This is called pre-exposure prophylaxis (PrEP). You are considered at risk if:  You are sexually active and do not regularly use condoms or know the HIV status of your partner(s).  You take drugs by injection.  You are sexually  active with a partner who has HIV. Talk with your health care provider about whether you are at high risk of being infected with HIV. If you choose to begin PrEP, you should first be tested for HIV. You should then be tested every 3 months for as long as you are taking PrEP.  PREGNANCY   If you are premenopausal and you may become pregnant, ask your health care provider about preconception counseling.  If you may  become pregnant, take 400 to 800 micrograms (mcg) of folic acid every day.  If you want to prevent pregnancy, talk to your health care provider about birth control (contraception). OSTEOPOROSIS AND MENOPAUSE   Osteoporosis is a disease in which the bones lose minerals and strength with aging. This can result in serious bone fractures. Your risk for osteoporosis can be identified using a bone density scan.  If you are 61 years of age or older, or if you are at risk for osteoporosis and fractures, ask your health care provider if you should be screened.  Ask your health care provider whether you should take a calcium or vitamin D supplement to lower your risk for osteoporosis.  Menopause may have certain physical symptoms and risks.  Hormone replacement therapy may reduce some of these symptoms and risks. Talk to your health care provider about whether hormone replacement therapy is right for you.  HOME CARE INSTRUCTIONS   Schedule regular health, dental, and eye exams.  Stay current with your immunizations.   Do not use any tobacco products including cigarettes, chewing tobacco, or electronic cigarettes.  If you are pregnant, do not drink alcohol.  If you are breastfeeding, limit how much and how often you drink alcohol.  Limit alcohol intake to no more than 1 drink per day for nonpregnant women. One drink equals 12 ounces of beer, 5 ounces of wine, or 1 ounces of hard liquor.  Do not use street drugs.  Do not share needles.  Ask your health care provider for help if  you need support or information about quitting drugs.  Tell your health care provider if you often feel depressed.  Tell your health care provider if you have ever been abused or do not feel safe at home.   This information is not intended to replace advice given to you by your health care provider. Make sure you discuss any questions you have with your health care provider.   Document Released: 04/26/2011 Document Revised: 11/01/2014 Document Reviewed: 09/12/2013 Elsevier Interactive Patient Education [employer].

## 2015-09-26 ENCOUNTER — Other Ambulatory Visit: Payer: Self-pay | Admitting: Family

## 2015-09-26 LAB — CMP14+EGFR
A/G RATIO: 1.6 (ref 1.1–2.5)
ALT: 14 IU/L (ref 0–32)
AST: 14 IU/L (ref 0–40)
Albumin: 4.1 g/dL (ref 3.5–5.5)
Alkaline Phosphatase: 83 IU/L (ref 39–117)
BILIRUBIN TOTAL: 0.4 mg/dL (ref 0.0–1.2)
BUN/Creatinine Ratio: 16 (ref 9–23)
BUN: 11 mg/dL (ref 6–24)
CHLORIDE: 103 mmol/L (ref 97–106)
CO2: 23 mmol/L (ref 18–29)
Calcium: 8.7 mg/dL (ref 8.7–10.2)
Creatinine, Ser: 0.7 mg/dL (ref 0.57–1.00)
GFR calc non Af Amer: 106 mL/min/{1.73_m2} (ref >59)
GFR, EST AFRICAN AMERICAN: 122 mL/min/{1.73_m2} (ref >59)
GLOBULIN, TOTAL: 2.5 g/dL (ref 1.5–4.5)
Glucose: 90 mg/dL (ref 65–99)
POTASSIUM: 4.3 mmol/L (ref 3.5–5.2)
SODIUM: 139 mmol/L (ref 136–144)
Total Protein: 6.6 g/dL (ref 6.0–8.5)

## 2015-09-26 LAB — LIPID PANEL
CHOL/HDL RATIO: 4.2 ratio (ref 0.0–4.4)
Cholesterol, Total: 209 mg/dL — ABNORMAL HIGH (ref 100–199)
HDL: 50 mg/dL (ref >39)
LDL Calculated: 127 mg/dL — ABNORMAL HIGH (ref 0–99)
TRIGLYCERIDES: 161 mg/dL — AB (ref 0–149)
VLDL Cholesterol Cal: 32 mg/dL (ref 5–40)

## 2015-09-26 MED ORDER — ATORVASTATIN CALCIUM 10 MG PO TABS: 10.0000 mg | ORAL_TABLET | Freq: Every day | ORAL | Status: DC

## 2015-11-28 ENCOUNTER — Encounter: Payer: Self-pay | Admitting: Family

## 2015-11-28 ENCOUNTER — Ambulatory Visit (INDEPENDENT_AMBULATORY_CARE_PROVIDER_SITE_OTHER): Payer: BLUE CROSS/BLUE SHIELD | Admitting: Family

## 2015-11-28 VITALS — BP 117/83 | HR 105 | Temp 99.4°F | Ht 61.0 in | Wt 147.8 lb

## 2015-11-28 DIAGNOSIS — R6889 Other general symptoms and signs: Secondary | ICD-10-CM

## 2015-11-28 DIAGNOSIS — J020 Streptococcal pharyngitis: Secondary | ICD-10-CM

## 2015-11-28 LAB — POCT RAPID STREP A (OFFICE): Rapid Strep A Screen: POSITIVE — AB

## 2015-11-28 LAB — POCT INFLUENZA A/B
INFLUENZA A, POC: NEGATIVE
Influenza B, POC: NEGATIVE

## 2015-11-28 MED ORDER — AMOXICILLIN 875 MG PO TABS: 875.0000 mg | ORAL_TABLET | Freq: Two times a day (BID) | ORAL | Status: DC

## 2015-11-28 NOTE — Patient Instructions (Signed)

## 2015-11-28 NOTE — Progress Notes (Signed)
Subjective:    Patient ID: Meghan Caldwell, female    DOB: 28-Nov-1970, 45 y.o.   MRN: [id number]  Sore Throat  This is a new problem. The current episode started in the past 7 days. The problem has been gradually worsening. Maximum temperature: 99.4. The pain is at a severity of 9/10. The pain is moderate. Associated symptoms include congestion, coughing, ear pain, headaches, a hoarse voice, swollen glands and trouble swallowing. Pertinent negatives include no ear discharge or shortness of breath. She has had no exposure to strep. She has tried acetaminophen for the symptoms. The treatment provided mild relief.  Fever  Associated symptoms include congestion, coughing, ear pain and headaches.      Review of Systems  Constitutional: Positive for fever.  HENT: Positive for congestion, ear pain, hoarse voice and trouble swallowing. Negative for ear discharge.   Eyes: Negative.   Respiratory: Positive for cough. Negative for shortness of breath.   Cardiovascular: Negative.  Negative for palpitations.  Gastrointestinal: Negative.   Endocrine: Negative.   Genitourinary: Negative.   Musculoskeletal: Negative.   Neurological: Positive for headaches.  Hematological: Negative.   Psychiatric/Behavioral: Negative.   All other systems reviewed and are negative.      Objective:   Physical Exam  Constitutional: She is oriented to person, place, and time. She appears well-developed and well-nourished. No distress.  HENT:  Head: Normocephalic and atraumatic.  Right Ear: External ear normal.  Left Ear: External ear normal.  Mouth/Throat: Oropharyngeal exudate present.  Nasal passage erythemas with mild swelling  oropharynxe erythemas   Eyes: Pupils are equal, round, and reactive to light.  Neck: Normal range of motion. Neck supple. No thyromegaly present.  Cardiovascular: Normal rate, regular rhythm, normal heart sounds and intact distal pulses.   No murmur heard. Pulmonary/Chest: Effort  normal and breath sounds normal. No respiratory distress. She has no wheezes.  Abdominal: Soft. Bowel sounds are normal. She exhibits no distension. There is no tenderness.  Musculoskeletal: Normal range of motion. She exhibits no edema or tenderness.  Neurological: She is alert and oriented to person, place, and time. She has normal reflexes. No cranial nerve deficit.  Skin: Skin is warm and dry.  Psychiatric: She has a normal mood and affect. Her behavior is normal. Judgment and thought content normal.  Vitals reviewed.  Results for orders placed or performed in visit on 11/28/15  POCT Influenza A/B  Result Value Ref Range   Influenza A, POC Negative Negative   Influenza B, POC Negative Negative  POCT rapid strep A  Result Value Ref Range   Rapid Strep A Screen Positive (A) Negative      BP 117/83 mmHg  Pulse 105  Temp(Src) 99.4 F (37.4 C) (Oral)  Ht  (1.549 m)  Wt 147 lb 12.8 oz (67.042 kg)  BMI 27.94 kg/m2     Assessment & Plan:  1. Flu-like symptoms - POCT Influenza A/B - POCT rapid strep A  2. Streptococcal sore throat -- Take meds as prescribed - Use a cool mist humidifier  -Use saline nose sprays frequently -Saline irrigations of the nose can be very helpful if done frequently.  * 4X daily for 1 week*  * Use of a nettie pot can be helpful with this. Follow directions with this* -Force fluids -For any cough or congestion  Use plain Mucinex- regular strength or max strength is fine   * Children- consult with Pharmacist for dosing -For fever or aces or pains- take tylenol or  ibuprofen appropriate for age and weight.  * for fevers greater than 101 orally you may alternate ibuprofen and tylenol every  3 hours. -Throat lozenges if help - amoxicillin (AMOXIL) 875 MG tablet; Take 1 tablet (875 mg total) by mouth 2 (two) times daily.  Dispense: 20 tablet; Refill: 0  Jannifer Rodney, FNP

## 2016-03-24 ENCOUNTER — Ambulatory Visit: Payer: Self-pay | Admitting: Family Medicine

## 2016-03-30 ENCOUNTER — Encounter: Payer: Self-pay | Admitting: Family

## 2016-03-30 ENCOUNTER — Ambulatory Visit (INDEPENDENT_AMBULATORY_CARE_PROVIDER_SITE_OTHER): Payer: BLUE CROSS/BLUE SHIELD | Admitting: Family

## 2016-03-30 VITALS — BP 114/84 | HR 71 | Temp 97.9°F | Ht 61.0 in | Wt 131.8 lb

## 2016-03-30 DIAGNOSIS — G43111 Migraine with aura, intractable, with status migrainosus: Secondary | ICD-10-CM | POA: Diagnosis not present

## 2016-03-30 DIAGNOSIS — F411 Generalized anxiety disorder: Secondary | ICD-10-CM | POA: Diagnosis not present

## 2016-03-30 DIAGNOSIS — F32A Depression, unspecified: Secondary | ICD-10-CM

## 2016-03-30 DIAGNOSIS — F329 Major depressive disorder, single episode, unspecified: Secondary | ICD-10-CM

## 2016-03-30 DIAGNOSIS — E785 Hyperlipidemia, unspecified: Secondary | ICD-10-CM

## 2016-03-30 DIAGNOSIS — Z23 Encounter for immunization: Secondary | ICD-10-CM | POA: Diagnosis not present

## 2016-03-30 LAB — CMP14+EGFR
A/G RATIO: 1.4 (ref 1.2–2.2)
ALT: 14 IU/L (ref 0–32)
AST: 19 IU/L (ref 0–40)
Albumin: 4.1 g/dL (ref 3.5–5.5)
Alkaline Phosphatase: 92 IU/L (ref 39–117)
BUN / CREAT RATIO: 10 (ref 9–23)
BUN: 9 mg/dL (ref 6–24)
Bilirubin Total: 0.5 mg/dL (ref 0.0–1.2)
CO2: 24 mmol/L (ref 18–29)
Calcium: 9.4 mg/dL (ref 8.7–10.2)
Chloride: 98 mmol/L (ref 96–106)
Creatinine, Ser: 0.91 mg/dL (ref 0.57–1.00)
GFR calc Af Amer: 88 mL/min/{1.73_m2} (ref >59)
GFR, EST NON AFRICAN AMERICAN: 76 mL/min/{1.73_m2} (ref >59)
Globulin, Total: 2.9 g/dL (ref 1.5–4.5)
Glucose: 94 mg/dL (ref 65–99)
Potassium: 4 mmol/L (ref 3.5–5.2)
Sodium: 138 mmol/L (ref 134–144)
Total Protein: 7 g/dL (ref 6.0–8.5)

## 2016-03-30 LAB — LIPID PANEL
CHOL/HDL RATIO: 3.5 ratio (ref 0.0–4.4)
CHOLESTEROL TOTAL: 214 mg/dL — AB (ref 100–199)
HDL: 61 mg/dL (ref >39)
LDL CALC: 126 mg/dL — AB (ref 0–99)
Triglycerides: 137 mg/dL (ref 0–149)
VLDL CHOLESTEROL CAL: 27 mg/dL (ref 5–40)

## 2016-03-30 MED ORDER — ESCITALOPRAM OXALATE 10 MG PO TABS: 10.0000 mg | ORAL_TABLET | Freq: Every day | ORAL | Status: AC

## 2016-03-30 NOTE — Progress Notes (Signed)
Subjective:    Patient ID: Meghan Caldwell, female    DOB: [birth year], 45 y.o.   MRN: [id number]  Pt presents to the office today for chronic follow up. Pt states she has not taken any of her medications for the last 3 months. Pt states she feels very depressed and hopeless at this time. Anxiety Presents for follow-up visit. Onset was 1 to 6 months ago. The problem has been waxing and waning. Symptoms include depressed mood, excessive worry, irritability, nervous/anxious behavior and restlessness. Patient reports no insomnia, palpitations, panic, shortness of breath or suicidal ideas. Symptoms occur occasionally. The symptoms are aggravated by family issues.   Her past medical history is significant for anxiety/panic attacks and depression. Past treatments include nothing.  Hyperlipidemia This is a chronic problem. The current episode started more than 1 year ago. The problem is uncontrolled. Recent lipid tests were reviewed and are high. Exacerbating diseases include obesity. Pertinent negatives include no shortness of breath. She is currently on no antihyperlipidemic treatment. The current treatment provides no improvement of lipids. Risk factors for coronary artery disease include dyslipidemia, female sex and hypertension.  Depression      The patient presents with depression.  This is a chronic problem.  The current episode started more than 1 year ago.   The onset quality is sudden.   The problem occurs intermittently.  Associated symptoms include fatigue, helplessness, hopelessness, restlessness, decreased interest and sad.  Associated symptoms include does not have insomnia, no headaches and no suicidal ideas.  Past treatments include nothing.  Compliance with treatment is good.  Risk factors include stress.   Past medical history includes anxiety and depression.   Migraine  This is a chronic problem. The current episode started more than 1 year ago. Episode frequency: PT states she hasn't had  one in 2-3 months. The problem has been rapidly improving. The pain is located in the frontal and occipital region. The pain does not radiate. The pain quality is similar to prior headaches. The pain is at a severity of 7/10. The pain is moderate. Associated symptoms include phonophobia and photophobia. Pertinent negatives include no blurred vision, fever or insomnia. Exacerbated by: stress. She has tried NSAIDs for the symptoms. The treatment provided moderate relief. Her past medical history is significant for migraine headaches and obesity. There is no history of migraines in the family.      Review of Systems  Constitutional: Positive for irritability and fatigue. Negative for fever.  HENT: Negative.   Eyes: Positive for photophobia. Negative for blurred vision.  Respiratory: Negative.  Negative for shortness of breath.   Cardiovascular: Negative.  Negative for palpitations.  Gastrointestinal: Negative.   Endocrine: Negative.   Genitourinary: Negative.   Musculoskeletal: Negative.   Neurological: Negative.  Negative for headaches.  Hematological: Negative.   Psychiatric/Behavioral: Positive for depression. Negative for suicidal ideas. The patient is nervous/anxious. The patient does not have insomnia.   All other systems reviewed and are negative.      Objective:   Physical Exam  Constitutional: She is oriented to person, place, and time. She appears well-developed and well-nourished. No distress.  HENT:  Head: Normocephalic and atraumatic.  Right Ear: External ear normal.  Left Ear: External ear normal.  Nose: Nose normal.  Mouth/Throat: Oropharynx is clear and moist.  Eyes: Pupils are equal, round, and reactive to light.  Neck: Normal range of motion. Neck supple. No thyromegaly present.  Cardiovascular: Normal rate, regular rhythm, normal heart sounds and intact distal  pulses.   No murmur heard. Pulmonary/Chest: Effort normal and breath sounds normal. No respiratory  distress. She has no wheezes.  Abdominal: Soft. Bowel sounds are normal. She exhibits no distension. There is no tenderness.  Musculoskeletal: Normal range of motion. She exhibits no edema or tenderness.  Neurological: She is alert and oriented to person, place, and time.  Skin: Skin is warm and dry.  Psychiatric: She has a normal mood and affect. Her behavior is normal. Judgment and thought content normal.  Vitals reviewed.     BP 114/84 mmHg  Pulse 71  Temp(Src) 97.9 F (36.6 C) (Oral)  Ht '5\' 1"'$  (1.549 m)  Wt 131 lb 12.8 oz (59.784 kg)  BMI 24.92 kg/m2  LMP 03/30/2016     Assessment & Plan:  1. Depression -Pt started on lexapro 10 mg today Stress management discussed - escitalopram (LEXAPRO) 10 MG tablet; Take 1 tablet (10 mg total) by mouth daily.  Dispense: 90 tablet; Refill: 3 - CMP14+EGFR  2. GAD (generalized anxiety disorder) --Pt started on lexapro 10 mg today Stress management discussed - escitalopram (LEXAPRO) 10 MG tablet; Take 1 tablet (10 mg total) by mouth daily.  Dispense: 90 tablet; Refill: 3 - CMP14+EGFR  3. Hyperlipidemia - CMP14+EGFR - Lipid panel  4. Intractable migraine with aura with status migrainosus - CMP14+EGFR   Continue all meds Labs pending Health Maintenance reviewed- TDAP given today Diet and exercise encouraged RTO 3 weeks to recheck GAD and Depression  Evelina Dun, FNP

## 2016-03-30 NOTE — Addendum Note (Signed)
Addended by: Almeta MonasSTONE, Delva Derden M on: 03/30/2016 08:49 AM   Modules accepted: Orders, SmartSet

## 2016-03-30 NOTE — Patient Instructions (Signed)
Health Maintenance, Female Adopting a healthy lifestyle and getting preventive care can go a long way to promote health and wellness. Talk with your health care provider about what schedule of regular examinations is right for you. This is a good chance for you to check in with your provider about disease prevention and staying healthy. In between checkups, there are plenty of things you can do on your own. Experts have done a lot of research about which lifestyle changes and preventive measures are most likely to keep you healthy. Ask your health care provider for more information. WEIGHT AND DIET  Eat a healthy diet  Be sure to include plenty of vegetables, fruits, low-fat dairy products, and lean protein.  Do not eat a lot of foods high in solid fats, added sugars, or salt.  Get regular exercise. This is one of the most important things you can do for your health.  Most adults should exercise for at least 150 minutes each week. The exercise should increase your heart rate and make you sweat (moderate-intensity exercise).  Most adults should also do strengthening exercises at least twice a week. This is in addition to the moderate-intensity exercise.  Maintain a healthy weight  Body mass index (BMI) is a measurement that can be used to identify possible weight problems. It estimates body fat based on height and weight. Your health care provider can help determine your BMI and help you achieve or maintain a healthy weight.  For females 20 years of age and older:   A BMI below 18.5 is considered underweight.  A BMI of 18.5 to 24.9 is normal.  A BMI of 25 to 29.9 is considered overweight.  A BMI of 30 and above is considered obese.  Watch levels of cholesterol and blood lipids  You should start having your blood tested for lipids and cholesterol at 46 years of age, then have this test every 5 years.  You may need to have your cholesterol levels checked more often if:  Your lipid  or cholesterol levels are high.  You are older than 46 years of age.  You are at high risk for heart disease.  CANCER SCREENING   Lung Cancer  Lung cancer screening is recommended for adults 55-80 years old who are at high risk for lung cancer because of a history of smoking.  A yearly low-dose CT scan of the lungs is recommended for people who:  Currently smoke.  Have quit within the past 15 years.  Have at least a 30-pack-year history of smoking. A pack year is smoking an average of one pack of cigarettes a day for 1 year.  Yearly screening should continue until it has been 15 years since you quit.  Yearly screening should stop if you develop a health problem that would prevent you from having lung cancer treatment.  Breast Cancer  Practice breast self-awareness. This means understanding how your breasts normally appear and feel.  It also means doing regular breast self-exams. Let your health care provider know about any changes, no matter how small.  If you are in your 20s or 30s, you should have a clinical breast exam (CBE) by a health care provider every 1-3 years as part of a regular health exam.  If you are 40 or older, have a CBE every year. Also consider having a breast X-ray (mammogram) every year.  If you have a family history of breast cancer, talk to your health care provider about genetic screening.  If you   are at high risk for breast cancer, talk to your health care provider about having an MRI and a mammogram every year.  Breast cancer gene (BRCA) assessment is recommended for women who have family members with BRCA-related cancers. BRCA-related cancers include:  Breast.  Ovarian.  Tubal.  Peritoneal cancers.  Results of the assessment will determine the need for genetic counseling and BRCA1 and BRCA2 testing. Cervical Cancer Your health care provider may recommend that you be screened regularly for cancer of the pelvic organs (ovaries, uterus, and  vagina). This screening involves a pelvic examination, including checking for microscopic changes to the surface of your cervix (Pap test). You may be encouraged to have this screening done every 3 years, beginning at age 21.  For women ages 30-65, health care providers may recommend pelvic exams and Pap testing every 3 years, or they may recommend the Pap and pelvic exam, combined with testing for human papilloma virus (HPV), every 5 years. Some types of HPV increase your risk of cervical cancer. Testing for HPV may also be done on women of any age with unclear Pap test results.  Other health care providers may not recommend any screening for nonpregnant women who are considered low risk for pelvic cancer and who do not have symptoms. Ask your health care provider if a screening pelvic exam is right for you.  If you have had past treatment for cervical cancer or a condition that could lead to cancer, you need Pap tests and screening for cancer for at least 20 years after your treatment. If Pap tests have been discontinued, your risk factors (such as having a new sexual partner) need to be reassessed to determine if screening should resume. Some women have medical problems that increase the chance of getting cervical cancer. In these cases, your health care provider may recommend more frequent screening and Pap tests. Colorectal Cancer  This type of cancer can be detected and often prevented.  Routine colorectal cancer screening usually begins at 46 years of age and continues through 46 years of age.  Your health care provider may recommend screening at an earlier age if you have risk factors for colon cancer.  Your health care provider may also recommend using home test kits to check for hidden blood in the stool.  A small camera at the end of a tube can be used to examine your colon directly (sigmoidoscopy or colonoscopy). This is done to check for the earliest forms of colorectal  cancer.  Routine screening usually begins at age 50.  Direct examination of the colon should be repeated every 5-10 years through 46 years of age. However, you may need to be screened more often if early forms of precancerous polyps or small growths are found. Skin Cancer  Check your skin from head to toe regularly.  Tell your health care provider about any new moles or changes in moles, especially if there is a change in a mole's shape or color.  Also tell your health care provider if you have a mole that is larger than the size of a pencil eraser.  Always use sunscreen. Apply sunscreen liberally and repeatedly throughout the day.  Protect yourself by wearing long sleeves, pants, a wide-brimmed hat, and sunglasses whenever you are outside. HEART DISEASE, DIABETES, AND HIGH BLOOD PRESSURE   High blood pressure causes heart disease and increases the risk of stroke. High blood pressure is more likely to develop in:  People who have blood pressure in the high end   of the normal range (130-139/85-89 mm Hg).  People who are overweight or obese.  People who are African American.  If you are 38-23 years of age, have your blood pressure checked every 3-5 years. If you are 61 years of age or older, have your blood pressure checked every year. You should have your blood pressure measured twice--once when you are at a hospital or clinic, and once when you are not at a hospital or clinic. Record the average of the two measurements. To check your blood pressure when you are not at a hospital or clinic, you can use:  An automated blood pressure machine at a pharmacy.  A home blood pressure monitor.  If you are between 45 years and 39 years old, ask your health care provider if you should take aspirin to prevent strokes.  Have regular diabetes screenings. This involves taking a blood sample to check your fasting blood sugar level.  If you are at a normal weight and have a low risk for diabetes,  have this test once every three years after 46 years of age.  If you are overweight and have a high risk for diabetes, consider being tested at a younger age or more often. PREVENTING INFECTION  Hepatitis B  If you have a higher risk for hepatitis B, you should be screened for this virus. You are considered at high risk for hepatitis B if:  You were born in a country where hepatitis B is common. Ask your health care provider which countries are considered high risk.  Your parents were born in a high-risk country, and you have not been immunized against hepatitis B (hepatitis B vaccine).  You have HIV or AIDS.  You use needles to inject street drugs.  You live with someone who has hepatitis B.  You have had sex with someone who has hepatitis B.  You get hemodialysis treatment.  You take certain medicines for conditions, including cancer, organ transplantation, and autoimmune conditions. Hepatitis C  Blood testing is recommended for:  Everyone born from [birth year] through 1966.  Anyone with known risk factors for hepatitis C. Sexually transmitted infections (STIs)  You should be screened for sexually transmitted infections (STIs) including gonorrhea and chlamydia if:  You are sexually active and are younger than 46 years of age.  You are older than 46 years of age and your health care provider tells you that you are at risk for this type of infection.  Your sexual activity has changed since you were last screened and you are at an increased risk for chlamydia or gonorrhea. Ask your health care provider if you are at risk.  If you do not have HIV, but are at risk, it may be recommended that you take a prescription medicine daily to prevent HIV infection. This is called pre-exposure prophylaxis (PrEP). You are considered at risk if:  You are sexually active and do not regularly use condoms or know the HIV status of your partner(s).  You take drugs by injection.  You are sexually  active with a partner who has HIV. Talk with your health care provider about whether you are at high risk of being infected with HIV. If you choose to begin PrEP, you should first be tested for HIV. You should then be tested every 3 months for as long as you are taking PrEP.  PREGNANCY   If you are premenopausal and you may become pregnant, ask your health care provider about preconception counseling.  If you may  become pregnant, take 400 to 800 micrograms (mcg) of folic acid every day.  If you want to prevent pregnancy, talk to your health care provider about birth control (contraception). OSTEOPOROSIS AND MENOPAUSE   Osteoporosis is a disease in which the bones lose minerals and strength with aging. This can result in serious bone fractures. Your risk for osteoporosis can be identified using a bone density scan.  If you are 61 years of age or older, or if you are at risk for osteoporosis and fractures, ask your health care provider if you should be screened.  Ask your health care provider whether you should take a calcium or vitamin D supplement to lower your risk for osteoporosis.  Menopause may have certain physical symptoms and risks.  Hormone replacement therapy may reduce some of these symptoms and risks. Talk to your health care provider about whether hormone replacement therapy is right for you.  HOME CARE INSTRUCTIONS   Schedule regular health, dental, and eye exams.  Stay current with your immunizations.   Do not use any tobacco products including cigarettes, chewing tobacco, or electronic cigarettes.  If you are pregnant, do not drink alcohol.  If you are breastfeeding, limit how much and how often you drink alcohol.  Limit alcohol intake to no more than 1 drink per day for nonpregnant women. One drink equals 12 ounces of beer, 5 ounces of wine, or 1 ounces of hard liquor.  Do not use street drugs.  Do not share needles.  Ask your health care provider for help if  you need support or information about quitting drugs.  Tell your health care provider if you often feel depressed.  Tell your health care provider if you have ever been abused or do not feel safe at home.   This information is not intended to replace advice given to you by your health care provider. Make sure you discuss any questions you have with your health care provider.   Document Released: 04/26/2011 Document Revised: 11/01/2014 Document Reviewed: 09/12/2013 Elsevier Interactive Patient Education [employer].

## 2016-03-31 ENCOUNTER — Other Ambulatory Visit: Payer: Self-pay | Admitting: Family

## 2016-03-31 MED ORDER — ATORVASTATIN CALCIUM 10 MG PO TABS: 10.0000 mg | ORAL_TABLET | Freq: Every day | ORAL | Status: DC

## 2016-04-22 ENCOUNTER — Ambulatory Visit: Payer: BLUE CROSS/BLUE SHIELD | Admitting: Family

## 2016-07-19 ENCOUNTER — Encounter: Payer: BLUE CROSS/BLUE SHIELD | Admitting: *Deleted

## 2017-05-10 LAB — CBC AND DIFFERENTIAL
HCT: 40 (ref 36–46)
Hemoglobin: 13.2 (ref 12.0–16.0)
NEUTROS ABS: 5
Platelets: 232 (ref 150–399)
WBC: 7.9

## 2017-05-10 LAB — HEPATIC FUNCTION PANEL
ALT: 17 (ref 7–35)
AST: 22 (ref 13–35)
Bilirubin, Total: 0.4

## 2017-05-10 LAB — BASIC METABOLIC PANEL
BUN: 7 (ref 4–21)
Creatinine: 0.8 (ref 0.5–1.1)
GLUCOSE: 86

## 2017-05-10 LAB — TSH: TSH: 1.29 (ref 0.41–5.90)

## 2017-05-10 LAB — VITAMIN B12: Vitamin B-12: 264

## 2017-07-14 ENCOUNTER — Encounter: Payer: Self-pay | Admitting: Family Medicine

## 2017-07-14 ENCOUNTER — Ambulatory Visit (INDEPENDENT_AMBULATORY_CARE_PROVIDER_SITE_OTHER): Payer: Commercial Managed Care - PPO | Admitting: Family Medicine

## 2017-07-14 ENCOUNTER — Other Ambulatory Visit (HOSPITAL_COMMUNITY)
Admission: RE | Admit: 2017-07-14 | Discharge: 2017-07-14 | Disposition: A | Discharge status: Home / Self Care | Payer: Commercial Managed Care - PPO | Source: Ambulatory Visit | Attending: Family Medicine | Admitting: Family Medicine

## 2017-07-14 ENCOUNTER — Encounter: Payer: Self-pay | Admitting: *Deleted

## 2017-07-14 VITALS — BP 123/82 | HR 62 | Temp 97.2°F | Resp 20 | Ht 61.0 in | Wt 136.2 lb

## 2017-07-14 DIAGNOSIS — N938 Other specified abnormal uterine and vaginal bleeding: Secondary | ICD-10-CM

## 2017-07-14 DIAGNOSIS — F418 Other specified anxiety disorders: Secondary | ICD-10-CM | POA: Diagnosis not present

## 2017-07-14 DIAGNOSIS — R109 Unspecified abdominal pain: Secondary | ICD-10-CM

## 2017-07-14 DIAGNOSIS — Z0000 Encounter for general adult medical examination without abnormal findings: Secondary | ICD-10-CM | POA: Diagnosis not present

## 2017-07-14 DIAGNOSIS — Z124 Encounter for screening for malignant neoplasm of cervix: Secondary | ICD-10-CM | POA: Diagnosis not present

## 2017-07-14 DIAGNOSIS — E538 Deficiency of other specified B group vitamins: Secondary | ICD-10-CM

## 2017-07-14 DIAGNOSIS — Z23 Encounter for immunization: Secondary | ICD-10-CM | POA: Diagnosis not present

## 2017-07-14 DIAGNOSIS — L659 Nonscarring hair loss, unspecified: Secondary | ICD-10-CM | POA: Insufficient documentation

## 2017-07-14 DIAGNOSIS — Z789 Other specified health status: Secondary | ICD-10-CM | POA: Diagnosis not present

## 2017-07-14 DIAGNOSIS — D7589 Other specified diseases of blood and blood-forming organs: Secondary | ICD-10-CM | POA: Diagnosis not present

## 2017-07-14 LAB — POCT URINE PREGNANCY: Preg Test, Ur: NEGATIVE

## 2017-07-14 LAB — CBC WITH DIFFERENTIAL/PLATELET
Basophils Absolute: 0.1 10*3/uL (ref 0.0–0.1)
Basophils Relative: 1.2 % (ref 0.0–3.0)
EOS PCT: 2.7 % (ref 0.0–5.0)
Eosinophils Absolute: 0.2 10*3/uL (ref 0.0–0.7)
HCT: 42.6 % (ref 36.0–46.0)
Hemoglobin: 14.2 g/dL (ref 12.0–15.0)
LYMPHS ABS: 2.2 10*3/uL (ref 0.7–4.0)
Lymphocytes Relative: 32.6 % (ref 12.0–46.0)
MCHC: 33.4 g/dL (ref 30.0–36.0)
MCV: 104.6 fl — AB (ref 78.0–100.0)
MONO ABS: 0.5 10*3/uL (ref 0.1–1.0)
MONOS PCT: 7.4 % (ref 3.0–12.0)
NEUTROS PCT: 56.1 % (ref 43.0–77.0)
Neutro Abs: 3.9 10*3/uL (ref 1.4–7.7)
PLATELETS: 242 10*3/uL (ref 150.0–400.0)
RBC: 4.07 Mil/uL (ref 3.87–5.11)
RDW: 13.7 % (ref 11.5–15.5)
WBC: 6.9 10*3/uL (ref 4.0–10.5)

## 2017-07-14 LAB — LUTEINIZING HORMONE: LH: 7.53 m[IU]/mL

## 2017-07-14 LAB — VITAMIN D 25 HYDROXY (VIT D DEFICIENCY, FRACTURES): VITD: 38.19 ng/mL (ref 30.00–100.00)

## 2017-07-14 LAB — FOLLICLE STIMULATING HORMONE: FSH: 14.2 m[IU]/mL

## 2017-07-14 LAB — TSH: TSH: 0.94 u[IU]/mL (ref 0.35–4.50)

## 2017-07-14 LAB — FOLATE: FOLATE: 18.3 ng/mL (ref >5.9)

## 2017-07-14 NOTE — Progress Notes (Signed)
Patient ID: Meghan Caldwell, female  DOB: Jun 26, 1971, 46 y.o.   MRN: [id number] Patient Care Team    Relationship Specialty Notifications Start End  Natalia Leatherwood, DO PCP - General Family Medicine  07/14/17     Chief Complaint  Patient presents with  . Establish Care    Subjective:  Meghan Caldwell is a 46 y.o.  female present for new patient establishment. All past medical history, surgical history, allergies, family history, immunizations, medications and social history were Obtained and entered in the electronic medical record today. All recent labs, ED visits and hospitalizations within the last year were reviewed.  Abnormal vaginal bleeding: Patient presents today as a new patient to discuss an episode of spotting after intercourse. She reports 2 weeks ago immediately after having intercourse, she went to the bathroom and observed brownish colored vaginal bleeding. She has never experienced anything like this before and it was concerning to her. She states her menstrual cycles are still monthly, last approximately 5 days, and are not heavy in nature. She does endorse having some pre-menopausal symptoms such as night sweats/hot flashes. She is married and in a monogamous relationship. She has not seen any additional vaginal spotting, however she feels she may be more cramping than usual. She would have been midcycle during the episode of postcoital bleeding. Patient's last menstrual period was 06/20/2017. Patient's last Pap smear was 08/20/2014, normal, no co-testing completed. She denies any abnormal Paps. She states on her last Pap she was told her "uterus is low ". She has had 3 vaginal deliveries, largest child 8 pounds. She endorses taken daily ibuprofen, BC powder her aspirin. She endorses drinking 3-4 alcoholic beverages a day. She reports she has noticed increase in hair loss over the last few months. She does not take a daily vitamin. She had recent labs 05/10/2017 with CBC  normal with the exception of MCV at 104 and MCH of 34.5. CMP within normal limits, TSH 1.2 within normal limits, B12 264. She denies chest pain, shortness of breath, dizziness or syncope. She denies vaginal irritation, vaginal discharge, urinary or bladder incontinence or dyspareunia.  Patient also has a significant medical history for depression with anxiety and migraine headaches. She is under the care of Lance Morin for her depression, which prescribes her medication of Lexapro 10 mg daily and trazodone 50 milligrams daily at bedtime.  Recently collected lab work from outside office was reviewed, as well as prior Pap smear result from 2015.  Depression screen [facility] 2/9 07/14/2017 03/30/2016 11/28/2015 08/15/2015 05/27/2015  Decreased Interest 0 1 0 1 3  Down, Depressed, Hopeless 1 1 0 1 3  PHQ - 2 Score 1 2 0 2 6  Altered sleeping 0 1 - 0 2  Tired, decreased energy 0 1 - 0 3  Change in appetite 0 1 - 0 3  Feeling bad or failure about yourself  0 1 - 0 2  Trouble concentrating 1 1 - 1 3  Moving slowly or fidgety/restless 0 0 - 0 3  Suicidal thoughts 0 0 - 0 0  PHQ-9 Score 2 7 - 3 22  Difficult doing work/chores Not difficult at all Very difficult - - -   No flowsheet data found.    Current Exercise Habits: The patient does not participate in regular exercise at present Exercise limited by: None identified Fall Risk  03/30/2016 08/15/2015 08/20/2014 03/04/2014  Falls in the past year? No No No No     Immunization  History  Administered Date(s) Administered  . Influenza,inj,Quad PF,6+ Mos 09/25/2015, 07/14/2017  . Tdap 03/30/2016    No exam data present  Past Medical History:  Diagnosis Date  . Depression with anxiety   . Frequent headaches    No Known Allergies Past Surgical History:  Procedure Laterality Date  . LUMBAR DISC SURGERY  2009   Ruptured disc  . TONSILLECTOMY  1979  . TUBAL LIGATION     Family History  Problem Relation Age of Onset  . Hyperlipidemia Mother    . Hypertension Mother    Social History   Social History  . Marital status: Married    Spouse name: N/A  . Number of children: 3  . Years of education: GED   Occupational History  . stay at home mom    Social History Main Topics  . Smoking status: Never Smoker  . Smokeless tobacco: Never Used  . Alcohol use Yes     Comment: rare  . Drug use: No  . Sexual activity: Yes    Partners: Male     Comment: Married   Other Topics Concern  . Not on file   Social History Narrative   Married. 3 children.    GED. SAHM.    Drinks caffeine daily.   Smoke detector in the home, wears her seatbelt.   Feels safe in her relationships.   Allergies as of 07/14/2017   No Known Allergies     Medication List       Accurate as of 07/14/17 12:26 PM. Always use your most recent med list.          escitalopram 10 MG tablet Commonly known as:  LEXAPRO Take 1 tablet (10 mg total) by mouth daily.   traZODone 50 MG tablet Commonly known as:  DESYREL Take 75 mg by mouth at bedtime.            Discharge Care Instructions        Start     Ordered   07/14/17 0000  Cytology - PAP    Question Answer Comment  Cytology Pap Ancillary Testing HPV if ASC-US (reflex)   Cytology Pap Ancillary Testing HPV w/PAP any interp.   Ethnicity Caucasian   Specimen Source Endocervix/Cervix      07/14/17 0858   07/14/17 0000  Cervicovaginal ancillary only    Comments:  GC/CH and wet prep  Question Answer Comment  Specimen Source Endocervix/Cervix   Ancillary Testing GC/Chlamydia   Ancillary Testing BD Affirm      07/14/17 0901   07/14/17 0000  TSH     07/14/17 0918   07/14/17 0000  FSH     07/14/17 0918   07/14/17 0000  LH     07/14/17 0918   07/14/17 0000  Estradiol     07/14/17 0918   07/14/17 0000  CBC w/Diff     07/14/17 0918   07/14/17 0000  Methylmalonic Acid     07/14/17 0921   07/14/17 0000  Folate     07/14/17 0921   07/14/17 0000  Vitamin B1     07/14/17 0921   07/14/17  0000  US Transvaginal Non-OB    Question Answer Comment  Reason for Exam (SYMPTOM  OR DIAGNOSIS REQUIRED) DUB   Preferred imaging location? [facility]      07/14/17 0933   07/14/17 0000  US Pelvis Complete    Question Answer Comment  Reason for Exam (SYMPTOM  OR DIAGNOSIS REQUIRED) DUb  Preferred imaging location? [facility]      07/14/17 0933   07/14/17 0000  Vitamin D (25 hydroxy)     07/14/17 0934   07/14/17 0000  POCT urine pregnancy     07/14/17 0935   07/14/17 0000  Flu Vaccine QUAD 6+ mos PF IM (Fluarix Quad PF)     07/14/17 0948      All past medical history, surgical history, allergies, family history, immunizations andmedications were updated in the EMR today and reviewed under the history and medication portions of their EMR.    Recent Results (from the past 2160 hour(s))  POCT urine pregnancy     Status: None   Collection Time: 07/14/17  9:55 AM  Result Value Ref Range   Preg Test, Ur Negative Negative    ROS: 14 pt review of systems performed and negative (unless mentioned in an HPI)  Objective: BP 123/82 (BP Location: Left Arm, Patient Position: Sitting, Cuff Size: Normal)   Pulse 62   Temp (!) 97.2 F (36.2 C)   Resp 20   Ht  (1.549 m)   Wt 136 lb 4 oz (61.8 kg)   LMP 06/20/2017   SpO2 97%   BMI 25.74 kg/m  Gen: Afebrile. No acute distress. Nontoxic in appearance, well-developed, well-nourished, Well-developed, pleasant Caucasian female. HENT: AT. [place]. MMM, no oral lesions, Eyes:Pupils Equal Round Reactive to light, Extraocular movements intact,  Conjunctiva without redness, discharge or icterus. Neck/lymp/endocrine: Supple, no lymphadenopathy, no thyromegaly CV: RRR no murmur appreciated, no edema, +2/4 P posterior tibialis pulses.  Chest: CTAB, no wheeze, rhonchi or crackles. Normal Respiratory effort. Good Air movement. Abd: Soft. Flat. NTND. BS present. No Masses palpated. No hepatosplenomegaly. No rebound tenderness or  guarding. Skin: Warm and well-perfused. Skin intact. Neuro/Msk: Normal gait. PERLA. EOMi. Alert. Oriented x3.   Psych: Normal affect, dress and demeanor. Normal speech. Normal thought content and judgment. GYN:  External genitalia within normal limits, normal hair distribution, no lesions. Urethral meatus normal, no lesions. Vaginal mucosa pink, moist, normal rugae, no lesions. No cystocele or rectocele. cervix with multiple nabothian cysts, mild white discharge present. Bimanual exam revealed normal uterus.  No uterine prolapse present. No bladder/suprapubic fullness, masses or tenderness. No cervical motion tenderness. No adnexal fullness. Anus and perineum within normal limits, no lesions.  Assessment/plan: Meghan Caldwell is a 46 y.o. female present for  Pap smear for cervical cancer screening Uterine bleeding, dysfunctional Abdominal cramping - Discussed dysfunctional uterine bleeding with her today. Possibly secondary to advancing perimenopausal state. She does have shortening of the vaginal vault, but no frank prolapse. Completed Pap today with HPV code testing, along with wet prep/GC chlamydia. Patient's cervix was abnormal with many small cystic-like areas that could be consistent with nabothian cysts, but cannot rule out lesion given the amount of cyst in appearance. - Ordered ultrasound of pelvis for visualization of anatomy and uterine lining. -  Cytology - PAP with HPV Co-test - TSH - FSH - LH - Estradiol - CBC w/Diff - US Transvaginal Non-OB; Future - US Pelvis Complete; Future - POCT urine pregnancy--> negative - Follow-up dependent on results, if ultrasound or Pap is abnormal, will refer to gynecology.   B12 deficiency/macrocytosis without anemia/alcohol ingestion of 1-4 drinks per day. - Discussed alcohol consumption likely cause of her macrocytosis.  - Discussion with her today on different causes of macrocytosis, including daily alcohol consumption, thyroid disorder  and/or vitamin deficiencies. Encourage her to start a B complex vitamin. Attempt to  cut back on alcohol consumption. Labs will be collected today, she will be called with results once available. - Methylmalonic Acid - Folate - Vitamin B1  Hair loss - Possibly secondary to thyroid disorder or malabsorption of vitamins. - Patient was encouraged take a multivitamin. - TSH - CBC w/Diff - Vitamin D (25 hydroxy)  Influenza vaccine administered - Flu Vaccine QUAD 6+ mos PF IM (Fluarix Quad PF)  Depression with anxiety Followed by psychiatric provider for routine follow-up and medication  No Follow-up on file.  Greater than 45 minutes was spent with patient, greater than 50% of that time was spent face-to-face with patient counseling     Note is dictated utilizing voice recognition software. Although note has been proof read prior to signing, occasional typographical errors still can be missed. If any questions arise, please do not hesitate to call for verification.  Electronically signed by: Felix Pacini, DO [facility] [place]

## 2017-07-14 NOTE — Patient Instructions (Addendum)
Pleasure to meet you today.  Start a B complex vitamin. Avoid daily alcohol consumption if possible.  Try to avoid daily ibuprohen, BC powder etc, this can thin the blood and cause more bleeding.   I will call you with labs and PAP results once all resulted.  I have ordered an ultrasound, they will call to schedule you to have completed at [facility]. We will call you after results are forwarded to [place].     Dysfunctional Uterine Bleeding Dysfunctional uterine bleeding is abnormal bleeding from the uterus. Dysfunctional uterine bleeding includes:  A period that comes earlier or later than usual.  A period that is lighter, heavier, or has blood clots.  Bleeding between periods.  Skipping one or more periods.  Bleeding after sexual intercourse.  Bleeding after menopause.  Follow these instructions at home: Pay attention to any changes in your symptoms. Follow these instructions to help with your condition: Eating and drinking  Eat well-balanced meals. Include foods that are high in iron, such as liver, meat, shellfish, green leafy vegetables, and eggs.  If you become constipated: ? Drink plenty of water. ? Eat fruits and vegetables that are high in water and fiber, such as spinach, carrots, raspberries, apples, and mango. Medicines  Take over-the-counter and prescription medicines only as told by your health care provider.  Do not change medicines without talking with your health care provider.  Aspirin or medicines that contain aspirin may make the bleeding worse. Do not take those medicines: ? During the week before your period. ? During your period.  If you were prescribed iron pills, take them as told by your health care provider. Iron pills help to replace iron that your body loses because of this condition. Activity  If you need to change your sanitary pad or tampon more than one time every 2 hours: ? Lie in bed with your feet raised (elevated). ? Place a  cold pack on your lower abdomen. ? Rest as much as possible until the bleeding stops or slows down.  Do not try to lose weight until the bleeding has stopped and your blood iron level is back to normal. Other Instructions  For two months, write down: ? When your period starts. ? When your period ends. ? When any abnormal bleeding occurs. ? What problems you notice.  Keep all follow up visits as told by your health care provider. This is important. Contact a health care provider if:  You get light-headed or weak.  You have nausea and vomiting.  You cannot eat or drink without vomiting.  You feel dizzy or have diarrhea while you are taking medicines.  You are taking birth control pills or hormones, and you want to change them or stop taking them. Get help right away if:  You develop a fever or chills.  You need to change your sanitary pad or tampon more than one time per hour.  Your bleeding becomes heavier, or your flow contains clots more often.  You develop pain in your abdomen.  You lose consciousness.  You develop a rash. This information is not intended to replace advice given to you by your health care provider. Make sure you discuss any questions you have with your health care provider. Document Released: 10/08/2000 Document Revised: 03/18/2016 Document Reviewed: 01/06/2015 Elsevier Interactive Patient Education  [employer].   Please help [place] help you:  We are honored you have chosen [facility] for your Primary Care home. Below you will find  basic instructions that you may need to access in the future. Please help [place] help you by reading the instructions, which cover many of the frequent questions we experience.   Prescription refills and request:  -In order to allow more efficient response time, please call your pharmacy for all refills. They will forward the request electronically to [place]. This allows for the quickest possible response. Request left on  a nurse line can take longer to refill, since these are checked as time allows between office patients and other phone calls.  - refill request can take up to 3-5 working days to complete.  - If request is sent electronically and request is appropiate, it is usually completed in 1-2 business days.  - all patients will need to be seen routinely for all chronic medical conditions requiring prescription medications (see follow-up below). If you are overdue for follow up on your condition, you will be asked to make an appointment and we will call in enough medication to cover you until your appointment (up to 30 days).  - all controlled substances will require a face to face visit to request/refill.  - if you desire your prescriptions to go through a new pharmacy, and have an active script at original pharmacy, you will need to call your pharmacy and have scripts transferred to new pharmacy. This is completed between the pharmacy locations and not by your provider.    Results: If any images or labs were ordered, it can take up to 1 week to get results depending on the test ordered and the lab/facility running and resulting the test. - Normal or stable results, which do not need further discussion, may be released to your mychart immediately with attached note to you. A call may not be generated for normal results. Please make certain to sign up for mychart. If you have questions on how to activate your mychart you can call the front office.  - If your results need further discussion, our office will attempt to contact you via phone, and if unable to reach you after 2 attempts, we will release your abnormal result to your mychart with instructions.  - All results will be automatically released in mychart after 1 week.  - Your provider will provide you with explanation and instruction on all relevant material in your results. Please keep in mind, results and labs may appear confusing or abnormal to the  untrained eye, but it does not mean they are actually abnormal for you personally. If you have any questions about your results that are not covered, or you desire more detailed explanation than what was provided, you should make an appointment with your provider to do so.   Our office handles many outgoing and incoming calls daily. If we have not contacted you within 1 week about your results, please check your mychart to see if there is a message first and if not, then contact our office.  In helping with this matter, you help decrease call volume, and therefore allow [place] to be able to respond to patients needs more efficiently.   Acute office visits (sick visit):  An acute visit is intended for a new problem and are scheduled in shorter time slots to allow schedule openings for patients with new problems. This is the appropriate visit to discuss a new problem. In order to provide you with excellent quality medical care with proper time for you to explain your problem, have an exam and receive treatment with instructions, these appointments  should be limited to one new problem per visit. If you experience a new problem, in which you desire to be addressed, please make an acute office visit, we save openings on the schedule to accommodate you. Please do not save your new problem for any other type of visit, let us take care of it properly and quickly for you.   Follow up visits:  Depending on your condition(s) your provider will need to see you routinely in order to provide you with quality care and prescribe medication(s). Most chronic conditions (Example: hypertension, Diabetes, depression/anxiety... etc), require visits a couple times a year. Your provider will instruct you on proper follow up for your personal medical conditions and history. Please make certain to make follow up appointments for your condition as instructed. Failing to do so could result in lapse in your medication treatment/refills. If  you request a refill, and are overdue to be seen on a condition, we will always provide you with a 30 day script (once) to allow you time to schedule.    Medicare wellness (well visit): - we have a wonderful Nurse Selena Batten), that will meet with you and provide you will yearly medicare wellness visits. These visits should occur yearly (can not be scheduled less than 1 calendar year apart) and cover preventive health, immunizations, advance directives and screenings you are entitled to yearly through your medicare benefits. Do not miss out on your entitled benefits, this is when medicare will pay for these benefits to be ordered for you.  These are strongly encouraged by your provider and is the appropriate type of visit to make certain you are up to date with all preventive health benefits. If you have not had your medicare wellness exam in the last 12 months, please make certain to schedule one by calling the office and schedule your medicare wellness with Selena Batten as soon as possible.   Yearly physical (well visit):  - Adults are recommended to be seen yearly for physicals. Check with your insurance and date of your last physical, most insurances require one calendar year between physicals. Physicals include all preventive health topics, screenings, medical exam and labs that are appropriate for gender/age and history. You may have fasting labs needed at this visit. This is a well visit (not a sick visit), new problems should not be covered during this visit (see acute visit).  - Pediatric patients are seen more frequently when they are younger. Your provider will advise you on well child visit timing that is appropriate for your their age. - This is not a medicare wellness visit. Medicare wellness exams do not have an exam portion to the visit. Some medicare companies allow for a physical, some do not allow a yearly physical. If your medicare allows a yearly physical you can schedule the medicare wellness with our  nurse Selena Batten and have your physical with your provider after, on the same day. Please check with insurance for your full benefits.   Late Policy/No Shows:  - all new patients should arrive 15-30 minutes earlier than appointment to allow [place] time  to  obtain all personal demographics,  insurance information and for you to complete office paperwork. - All established patients should arrive 10-15 minutes earlier than appointment time to update all information and be checked in .  - In our best efforts to run on time, if you are late for your appointment you will be asked to either reschedule or if able, we will work you back into the  schedule. There will be a wait time to work you back in the schedule,  depending on availability.  - If you are unable to make it to your appointment as scheduled, please call 24 hours ahead of time to allow [place] to fill the time slot with someone else who needs to be seen. If you do not cancel your appointment ahead of time, you may be charged a no show fee.

## 2017-07-15 ENCOUNTER — Telehealth: Payer: Self-pay | Admitting: Family Medicine

## 2017-07-15 LAB — CERVICOVAGINAL ANCILLARY ONLY
Bacterial vaginitis: NEGATIVE
Candida vaginitis: POSITIVE — AB
Chlamydia: NEGATIVE
NEISSERIA GONORRHEA: NEGATIVE
TRICH (WINDOWPATH): NEGATIVE

## 2017-07-15 MED ORDER — FLUCONAZOLE 150 MG PO TABS: 150.0000 mg | ORAL_TABLET | Freq: Once | ORAL | 0 refills | Status: AC

## 2017-07-15 NOTE — Telephone Encounter (Signed)
Please call pt: - all of her labs have not returned yet. However, we did receive part of the collection back and it appears she has a yeast infection. I have called the one time pill called diflucan into the drug store for her.   - we will contact her with the rest of her results next week they all return.

## 2017-07-15 NOTE — Telephone Encounter (Signed)
Spoke with patient reviewed information and instructions. 

## 2017-07-18 LAB — CYTOLOGY - PAP
DIAGNOSIS: NEGATIVE
HPV (WINDOPATH): NOT DETECTED

## 2017-07-19 ENCOUNTER — Ambulatory Visit (HOSPITAL_BASED_OUTPATIENT_CLINIC_OR_DEPARTMENT_OTHER): Payer: Commercial Managed Care - PPO

## 2017-07-19 LAB — VITAMIN B1: Vitamin B1 (Thiamine): 8 nmol/L (ref 8–30)

## 2017-07-19 LAB — METHYLMALONIC ACID, SERUM: METHYLMALONIC ACID, QUANT: 119 nmol/L (ref 87–318)

## 2017-07-19 LAB — ESTRADIOL: Estradiol: 76 pg/mL

## 2017-07-19 LAB — TIQ-NTM
